# Patient Record
Sex: Male | Born: 1950
Health system: Southern US, Community
[De-identification: ages and names within clinical notes are randomized; demographics above are authoritative.]

## PROBLEM LIST (undated history)

## (undated) DIAGNOSIS — Z9289 Personal history of other medical treatment: Secondary | ICD-10-CM

## (undated) DIAGNOSIS — R531 Weakness: Secondary | ICD-10-CM

## (undated) DIAGNOSIS — N400 Enlarged prostate without lower urinary tract symptoms: Secondary | ICD-10-CM

## (undated) DIAGNOSIS — R351 Nocturia: Secondary | ICD-10-CM

## (undated) DIAGNOSIS — F32A Depression, unspecified: Secondary | ICD-10-CM

## (undated) DIAGNOSIS — N200 Calculus of kidney: Secondary | ICD-10-CM

## (undated) DIAGNOSIS — A159 Respiratory tuberculosis unspecified: Secondary | ICD-10-CM

## (undated) DIAGNOSIS — F329 Major depressive disorder, single episode, unspecified: Secondary | ICD-10-CM

## (undated) DIAGNOSIS — E785 Hyperlipidemia, unspecified: Secondary | ICD-10-CM

## (undated) DIAGNOSIS — H409 Unspecified glaucoma: Secondary | ICD-10-CM

## (undated) DIAGNOSIS — J449 Chronic obstructive pulmonary disease, unspecified: Secondary | ICD-10-CM

## (undated) DIAGNOSIS — I10 Essential (primary) hypertension: Secondary | ICD-10-CM

## (undated) DIAGNOSIS — M549 Dorsalgia, unspecified: Secondary | ICD-10-CM

## (undated) HISTORY — PX: OTHER SURGICAL HISTORY: SHX169

## (undated) HISTORY — PX: COLONOSCOPY: SHX174

## (undated) HISTORY — PX: RETINAL DETACHMENT SURGERY: SHX105

---

## 2014-10-07 DIAGNOSIS — H4011X2 Primary open-angle glaucoma, moderate stage: Secondary | ICD-10-CM | POA: Diagnosis not present

## 2014-10-25 DIAGNOSIS — E782 Mixed hyperlipidemia: Secondary | ICD-10-CM | POA: Diagnosis not present

## 2014-10-25 DIAGNOSIS — Z125 Encounter for screening for malignant neoplasm of prostate: Secondary | ICD-10-CM | POA: Diagnosis not present

## 2014-10-25 DIAGNOSIS — F329 Major depressive disorder, single episode, unspecified: Secondary | ICD-10-CM | POA: Diagnosis not present

## 2014-10-25 DIAGNOSIS — Z1211 Encounter for screening for malignant neoplasm of colon: Secondary | ICD-10-CM | POA: Diagnosis not present

## 2014-10-25 DIAGNOSIS — I10 Essential (primary) hypertension: Secondary | ICD-10-CM | POA: Diagnosis not present

## 2014-10-25 DIAGNOSIS — Z23 Encounter for immunization: Secondary | ICD-10-CM | POA: Diagnosis not present

## 2014-11-09 DIAGNOSIS — Z1211 Encounter for screening for malignant neoplasm of colon: Secondary | ICD-10-CM | POA: Diagnosis not present

## 2015-01-03 DIAGNOSIS — H4011X2 Primary open-angle glaucoma, moderate stage: Secondary | ICD-10-CM | POA: Diagnosis not present

## 2015-02-24 DIAGNOSIS — L570 Actinic keratosis: Secondary | ICD-10-CM | POA: Diagnosis not present

## 2015-04-27 DIAGNOSIS — F339 Major depressive disorder, recurrent, unspecified: Secondary | ICD-10-CM | POA: Diagnosis not present

## 2015-04-27 DIAGNOSIS — Z6832 Body mass index (BMI) 32.0-32.9, adult: Secondary | ICD-10-CM | POA: Diagnosis not present

## 2015-04-27 DIAGNOSIS — E6609 Other obesity due to excess calories: Secondary | ICD-10-CM | POA: Diagnosis not present

## 2015-04-27 DIAGNOSIS — I10 Essential (primary) hypertension: Secondary | ICD-10-CM | POA: Diagnosis not present

## 2015-04-27 DIAGNOSIS — E782 Mixed hyperlipidemia: Secondary | ICD-10-CM | POA: Diagnosis not present

## 2015-05-10 DIAGNOSIS — M545 Low back pain: Secondary | ICD-10-CM | POA: Diagnosis not present

## 2015-05-23 DIAGNOSIS — M545 Low back pain: Secondary | ICD-10-CM | POA: Diagnosis not present

## 2015-06-02 DIAGNOSIS — M545 Low back pain: Secondary | ICD-10-CM | POA: Diagnosis not present

## 2015-06-03 DIAGNOSIS — M5416 Radiculopathy, lumbar region: Secondary | ICD-10-CM | POA: Diagnosis not present

## 2015-06-06 ENCOUNTER — Other Ambulatory Visit: Payer: Self-pay | Admitting: Orthopedic Surgery

## 2015-06-15 ENCOUNTER — Encounter (HOSPITAL_COMMUNITY): Payer: Self-pay

## 2015-06-15 ENCOUNTER — Ambulatory Visit (HOSPITAL_COMMUNITY)
Admission: RE | Admit: 2015-06-15 | Discharge: 2015-06-15 | Disposition: A | Discharge status: Home / Self Care | Payer: Medicare Other | Source: Ambulatory Visit | Attending: Orthopedic Surgery | Admitting: Orthopedic Surgery

## 2015-06-15 ENCOUNTER — Encounter (HOSPITAL_COMMUNITY)
Admission: RE | Admit: 2015-06-15 | Discharge: 2015-06-15 | Disposition: A | Discharge status: Home / Self Care | Payer: Medicare Other | Source: Ambulatory Visit | Attending: Orthopedic Surgery | Admitting: Orthopedic Surgery

## 2015-06-15 DIAGNOSIS — M5126 Other intervertebral disc displacement, lumbar region: Secondary | ICD-10-CM | POA: Diagnosis present

## 2015-06-15 DIAGNOSIS — G8929 Other chronic pain: Secondary | ICD-10-CM | POA: Diagnosis not present

## 2015-06-15 DIAGNOSIS — I10 Essential (primary) hypertension: Secondary | ICD-10-CM | POA: Diagnosis not present

## 2015-06-15 DIAGNOSIS — J449 Chronic obstructive pulmonary disease, unspecified: Secondary | ICD-10-CM | POA: Diagnosis not present

## 2015-06-15 DIAGNOSIS — M5116 Intervertebral disc disorders with radiculopathy, lumbar region: Secondary | ICD-10-CM | POA: Diagnosis not present

## 2015-06-15 DIAGNOSIS — Z87891 Personal history of nicotine dependence: Secondary | ICD-10-CM | POA: Diagnosis not present

## 2015-06-15 DIAGNOSIS — F329 Major depressive disorder, single episode, unspecified: Secondary | ICD-10-CM | POA: Diagnosis not present

## 2015-06-15 DIAGNOSIS — E785 Hyperlipidemia, unspecified: Secondary | ICD-10-CM | POA: Diagnosis not present

## 2015-06-15 DIAGNOSIS — Z01818 Encounter for other preprocedural examination: Secondary | ICD-10-CM

## 2015-06-15 DIAGNOSIS — J984 Other disorders of lung: Secondary | ICD-10-CM | POA: Diagnosis not present

## 2015-06-15 DIAGNOSIS — Z6835 Body mass index (BMI) 35.0-35.9, adult: Secondary | ICD-10-CM | POA: Diagnosis not present

## 2015-06-15 HISTORY — DX: Essential (primary) hypertension: I10

## 2015-06-15 HISTORY — DX: Unspecified glaucoma: H40.9

## 2015-06-15 HISTORY — DX: Weakness: R53.1

## 2015-06-15 HISTORY — DX: Nocturia: R35.1

## 2015-06-15 HISTORY — DX: Depression, unspecified: F32.A

## 2015-06-15 HISTORY — DX: Chronic obstructive pulmonary disease, unspecified: J44.9

## 2015-06-15 HISTORY — DX: Respiratory tuberculosis unspecified: A15.9

## 2015-06-15 HISTORY — DX: Hyperlipidemia, unspecified: E78.5

## 2015-06-15 HISTORY — DX: Personal history of other medical treatment: Z92.89

## 2015-06-15 HISTORY — DX: Benign prostatic hyperplasia without lower urinary tract symptoms: N40.0

## 2015-06-15 HISTORY — DX: Major depressive disorder, single episode, unspecified: F32.9

## 2015-06-15 HISTORY — DX: Dorsalgia, unspecified: M54.9

## 2015-06-15 LAB — CBC WITH DIFFERENTIAL/PLATELET
BASOS ABS: 0.1 10*3/uL (ref 0.0–0.1)
BASOS PCT: 1 % (ref 0–1)
Eosinophils Absolute: 0.1 10*3/uL (ref 0.0–0.7)
Eosinophils Relative: 2 % (ref 0–5)
HEMATOCRIT: 42.6 % (ref 39.0–52.0)
HEMOGLOBIN: 14.7 g/dL (ref 13.0–17.0)
Lymphocytes Relative: 33 % (ref 12–46)
Lymphs Abs: 2.2 10*3/uL (ref 0.7–4.0)
MCH: 32 pg (ref 26.0–34.0)
MCHC: 34.5 g/dL (ref 30.0–36.0)
MCV: 92.8 fL (ref 78.0–100.0)
Monocytes Absolute: 0.5 10*3/uL (ref 0.1–1.0)
Monocytes Relative: 7 % (ref 3–12)
Neutro Abs: 3.7 10*3/uL (ref 1.7–7.7)
Neutrophils Relative %: 57 % (ref 43–77)
PLATELETS: 254 10*3/uL (ref 150–400)
RBC: 4.59 MIL/uL (ref 4.22–5.81)
RDW: 13.5 % (ref 11.5–15.5)
WBC: 6.6 10*3/uL (ref 4.0–10.5)

## 2015-06-15 LAB — SURGICAL PCR SCREEN
MRSA, PCR: NEGATIVE
Staphylococcus aureus: NEGATIVE

## 2015-06-15 LAB — APTT: APTT: 29 s (ref 24–37)

## 2015-06-15 LAB — COMPREHENSIVE METABOLIC PANEL
ALT: 24 U/L (ref 17–63)
AST: 21 U/L (ref 15–41)
Albumin: 4 g/dL (ref 3.5–5.0)
Alkaline Phosphatase: 57 U/L (ref 38–126)
Anion gap: 8 (ref 5–15)
BUN: 14 mg/dL (ref 6–20)
CHLORIDE: 106 mmol/L (ref 101–111)
CO2: 25 mmol/L (ref 22–32)
Calcium: 9.4 mg/dL (ref 8.9–10.3)
Creatinine, Ser: 1.12 mg/dL (ref 0.61–1.24)
Glucose, Bld: 102 mg/dL — ABNORMAL HIGH (ref 65–99)
POTASSIUM: 3.6 mmol/L (ref 3.5–5.1)
SODIUM: 139 mmol/L (ref 135–145)
Total Bilirubin: 0.4 mg/dL (ref 0.3–1.2)
Total Protein: 6.1 g/dL — ABNORMAL LOW (ref 6.5–8.1)

## 2015-06-15 LAB — URINALYSIS, ROUTINE W REFLEX MICROSCOPIC
Bilirubin Urine: NEGATIVE
Glucose, UA: NEGATIVE mg/dL
Hgb urine dipstick: NEGATIVE
Ketones, ur: NEGATIVE mg/dL
LEUKOCYTES UA: NEGATIVE
Nitrite: NEGATIVE
PROTEIN: NEGATIVE mg/dL
Specific Gravity, Urine: 1.01 (ref 1.005–1.030)
Urobilinogen, UA: 0.2 mg/dL (ref 0.0–1.0)
pH: 6 (ref 5.0–8.0)

## 2015-06-15 LAB — PROTIME-INR
INR: 1.04 (ref 0.00–1.49)
PROTHROMBIN TIME: 13.8 s (ref 11.6–15.2)

## 2015-06-15 MED ORDER — POVIDONE-IODINE 7.5 % EX SOLN: Freq: Once | CUTANEOUS | Status: DC

## 2015-06-15 NOTE — Progress Notes (Signed)
Pt doesn't have a cardiologist  Dr.Dean Alroy Dust is Medical Md  Denies ever having an echo/stress test/heart cath  Denies EKG or CXR in past

## 2015-06-15 NOTE — Progress Notes (Signed)
   06/15/15 1401  OBSTRUCTIVE SLEEP APNEA  Have you ever been diagnosed with sleep apnea through a sleep study? No  Do you snore loudly (loud enough to be heard through closed doors)?  1  Do you often feel tired, fatigued, or sleepy during the daytime? 0  Has anyone observed you stop breathing during your sleep? 1  Do you have, or are you being treated for high blood pressure? 1  BMI more than 35 kg/m2? 0  Age over 66 years old? 1  Neck circumference greater than 40 cm/16 inches? 1 (17.5)  Gender: 1

## 2015-06-15 NOTE — Pre-Procedure Instructions (Signed)
Steven Ellison  06/15/2015      [id number] [place], [place] [address] [place] [address] Phone: [phone] Fax: [phone]    Your procedure is scheduled on Wed, June 22 @ 12:55 PM  Report to [facility] Admitting at 10:00 AM  Call this number if you have problems the morning of surgery:  [phone]   Remember:  Do not eat food or drink liquids after midnight.  Take these medicines the morning of surgery with A SIP OF WATER:Eye Drop,Pain Pill(if needed),and Paxil(Paroxetine)              Stop taking your Fish Oil and Saw Palmetto. No Goody's,BC's,Aleve,Aspirin,Ibuprofen,or any Herbal Medications.    Do not wear jewelry.  Do not wear lotions, powders, or colognes.  You may wear deodorant.  Men may shave face and neck.  Do not bring valuables to the hospital.  [facility] is not responsible for any belongings or valuables.  Contacts, dentures or bridgework may not be worn into surgery.  Leave your suitcase in the car.  After surgery it may be brought to your room.  For patients admitted to the hospital, discharge time will be determined by your treatment team.  Patients discharged the day of surgery will not be allowed to drive home.    Special instructions:  Register - Preparing for Surgery  Before surgery, you can play an important role.  Because skin is not sterile, your skin needs to be as free of germs as possible.  You can reduce the number of germs on you skin by washing with CHG (chlorahexidine gluconate) soap before surgery.  CHG is an antiseptic cleaner which kills germs and bonds with the skin to continue killing germs even after washing.  Please DO NOT use if you have an allergy to CHG or antibacterial soaps.  If your skin becomes reddened/irritated stop using the CHG and inform your nurse when you arrive at Short Stay.  Do not shave (including legs and underarms) for at least 48 hours prior to the  first CHG shower.  You may shave your face.  Please follow these instructions carefully:   1.  Shower with CHG Soap the night before surgery and the                                morning of Surgery.  2.  If you choose to wash your hair, wash your hair first as usual with your       normal shampoo.  3.  After you shampoo, rinse your hair and body thoroughly to remove the                      Shampoo.  4.  Use CHG as you would any other liquid soap.  You can apply chg directly       to the skin and wash gently with scrungie or a clean washcloth.  5.  Apply the CHG Soap to your body ONLY FROM THE NECK DOWN.        Do not use on open wounds or open sores.  Avoid contact with your eyes,       ears, mouth and genitals (private parts).  Wash genitals (private parts)       with your normal soap.  6.  Wash thoroughly, paying special attention to the area where  your surgery        will be performed.  7.  Thoroughly rinse your body with warm water from the neck down.  8.  DO NOT shower/wash with your normal soap after using and rinsing off       the CHG Soap.  9.  Pat yourself dry with a clean towel.            10.  Wear clean pajamas.            11.  Place clean sheets on your bed the night of your first shower and do not        sleep with pets.  Day of Surgery  Do not apply any lotions/deoderants the morning of surgery.  Please wear clean clothes to the hospital/surgery center.    Please read over the following fact sheets that you were given. Pain Booklet, Coughing and Deep Breathing, MRSA Information and Surgical Site Infection Prevention

## 2015-06-21 MED ORDER — CEFAZOLIN SODIUM-DEXTROSE 2-3 GM-% IV SOLR
2.0000 g | INTRAVENOUS | Status: AC
  Administered 2015-06-22: 2 g via INTRAVENOUS
  Filled 2015-06-21: qty 50

## 2015-06-22 ENCOUNTER — Ambulatory Visit (HOSPITAL_COMMUNITY): Payer: Medicare Other

## 2015-06-22 ENCOUNTER — Encounter (HOSPITAL_COMMUNITY)
Admission: RE | Disposition: A | Discharge status: Home / Self Care | Payer: Medicare Other | Source: Ambulatory Visit | Attending: Orthopedic Surgery

## 2015-06-22 ENCOUNTER — Encounter (HOSPITAL_COMMUNITY): Payer: Self-pay | Admitting: *Deleted

## 2015-06-22 ENCOUNTER — Ambulatory Visit (HOSPITAL_COMMUNITY): Payer: Medicare Other | Admitting: Anesthesiology

## 2015-06-22 ENCOUNTER — Ambulatory Visit (HOSPITAL_COMMUNITY)
Admission: RE | Admit: 2015-06-22 | Discharge: 2015-06-22 | Disposition: A | Discharge status: Home / Self Care | Payer: Medicare Other | Source: Ambulatory Visit | Attending: Orthopedic Surgery | Admitting: Orthopedic Surgery

## 2015-06-22 DIAGNOSIS — J449 Chronic obstructive pulmonary disease, unspecified: Secondary | ICD-10-CM | POA: Diagnosis not present

## 2015-06-22 DIAGNOSIS — M5116 Intervertebral disc disorders with radiculopathy, lumbar region: Secondary | ICD-10-CM | POA: Insufficient documentation

## 2015-06-22 DIAGNOSIS — M5416 Radiculopathy, lumbar region: Secondary | ICD-10-CM | POA: Diagnosis not present

## 2015-06-22 DIAGNOSIS — F329 Major depressive disorder, single episode, unspecified: Secondary | ICD-10-CM | POA: Insufficient documentation

## 2015-06-22 DIAGNOSIS — Z6835 Body mass index (BMI) 35.0-35.9, adult: Secondary | ICD-10-CM | POA: Insufficient documentation

## 2015-06-22 DIAGNOSIS — M5417 Radiculopathy, lumbosacral region: Secondary | ICD-10-CM | POA: Diagnosis not present

## 2015-06-22 DIAGNOSIS — Z87891 Personal history of nicotine dependence: Secondary | ICD-10-CM | POA: Diagnosis not present

## 2015-06-22 DIAGNOSIS — M5126 Other intervertebral disc displacement, lumbar region: Secondary | ICD-10-CM | POA: Diagnosis not present

## 2015-06-22 DIAGNOSIS — I10 Essential (primary) hypertension: Secondary | ICD-10-CM | POA: Insufficient documentation

## 2015-06-22 DIAGNOSIS — M5410 Radiculopathy, site unspecified: Secondary | ICD-10-CM

## 2015-06-22 DIAGNOSIS — G8929 Other chronic pain: Secondary | ICD-10-CM | POA: Insufficient documentation

## 2015-06-22 DIAGNOSIS — M549 Dorsalgia, unspecified: Secondary | ICD-10-CM | POA: Diagnosis not present

## 2015-06-22 DIAGNOSIS — E785 Hyperlipidemia, unspecified: Secondary | ICD-10-CM | POA: Insufficient documentation

## 2015-06-22 HISTORY — PX: LUMBAR LAMINECTOMY/DECOMPRESSION MICRODISCECTOMY: SHX5026

## 2015-06-22 SURGERY — LUMBAR LAMINECTOMY/DECOMPRESSION MICRODISCECTOMY
Anesthesia: General | Site: Back

## 2015-06-22 MED ORDER — BUPIVACAINE-EPINEPHRINE 0.25% -1:200000 IJ SOLN
INTRAMUSCULAR | Status: DC | PRN
  Administered 2015-06-22: 13 mL

## 2015-06-22 MED ORDER — THROMBIN 20000 UNITS EX KIT
PACK | CUTANEOUS | Status: AC
  Filled 2015-06-22: qty 1

## 2015-06-22 MED ORDER — PROPOFOL 10 MG/ML IV BOLUS
INTRAVENOUS | Status: AC
  Filled 2015-06-22: qty 20

## 2015-06-22 MED ORDER — ONDANSETRON HCL 4 MG/2ML IJ SOLN
INTRAMUSCULAR | Status: AC
  Filled 2015-06-22: qty 2

## 2015-06-22 MED ORDER — MIDAZOLAM HCL 5 MG/5ML IJ SOLN
INTRAMUSCULAR | Status: DC | PRN
  Administered 2015-06-22: 2 mg via INTRAVENOUS

## 2015-06-22 MED ORDER — FENTANYL CITRATE (PF) 100 MCG/2ML IJ SOLN
INTRAMUSCULAR | Status: DC | PRN
  Administered 2015-06-22: 25 ug via INTRAVENOUS
  Administered 2015-06-22 (×2): 50 ug via INTRAVENOUS
  Administered 2015-06-22: 25 ug via INTRAVENOUS
  Administered 2015-06-22: 50 ug via INTRAVENOUS
  Administered 2015-06-22: 200 ug via INTRAVENOUS
  Administered 2015-06-22: 50 ug via INTRAVENOUS

## 2015-06-22 MED ORDER — FENTANYL CITRATE (PF) 250 MCG/5ML IJ SOLN
INTRAMUSCULAR | Status: AC
  Filled 2015-06-22: qty 5

## 2015-06-22 MED ORDER — FENTANYL CITRATE (PF) 250 MCG/5ML IJ SOLN
INTRAMUSCULAR | Status: AC
Start: 2015-06-22 — End: 2015-06-22
  Filled 2015-06-22: qty 5

## 2015-06-22 MED ORDER — OXYCODONE-ACETAMINOPHEN 5-325 MG PO TABS
1.0000 | ORAL_TABLET | Freq: Once | ORAL | Status: AC
  Administered 2015-06-22: 1 via ORAL

## 2015-06-22 MED ORDER — HYDROMORPHONE HCL 1 MG/ML IJ SOLN
0.2500 mg | INTRAMUSCULAR | Status: DC | PRN
  Administered 2015-06-22: 0.5 mg via INTRAVENOUS

## 2015-06-22 MED ORDER — ROCURONIUM BROMIDE 50 MG/5ML IV SOLN
INTRAVENOUS | Status: AC
  Filled 2015-06-22: qty 1

## 2015-06-22 MED ORDER — PROPOFOL 10 MG/ML IV BOLUS
INTRAVENOUS | Status: DC | PRN
  Administered 2015-06-22: 150 mg via INTRAVENOUS

## 2015-06-22 MED ORDER — LIDOCAINE HCL (CARDIAC) 20 MG/ML IV SOLN
INTRAVENOUS | Status: AC
  Filled 2015-06-22: qty 5

## 2015-06-22 MED ORDER — DEXAMETHASONE SODIUM PHOSPHATE 4 MG/ML IJ SOLN
INTRAMUSCULAR | Status: DC | PRN
  Administered 2015-06-22: 8 mg via INTRAVENOUS

## 2015-06-22 MED ORDER — MIDAZOLAM HCL 2 MG/2ML IJ SOLN
INTRAMUSCULAR | Status: AC
  Filled 2015-06-22: qty 2

## 2015-06-22 MED ORDER — ONDANSETRON HCL 4 MG/2ML IJ SOLN
INTRAMUSCULAR | Status: DC | PRN
  Administered 2015-06-22: 4 mg via INTRAVENOUS

## 2015-06-22 MED ORDER — BUPIVACAINE-EPINEPHRINE (PF) 0.25% -1:200000 IJ SOLN
INTRAMUSCULAR | Status: AC
  Filled 2015-06-22: qty 30

## 2015-06-22 MED ORDER — METHYLPREDNISOLONE ACETATE 40 MG/ML IJ SUSP
INTRAMUSCULAR | Status: AC
  Filled 2015-06-22: qty 1

## 2015-06-22 MED ORDER — OXYCODONE-ACETAMINOPHEN 5-325 MG PO TABS
ORAL_TABLET | ORAL | Status: AC
  Filled 2015-06-22: qty 1

## 2015-06-22 MED ORDER — METHYLENE BLUE 1 % INJ SOLN
INTRAMUSCULAR | Status: AC
  Filled 2015-06-22: qty 10

## 2015-06-22 MED ORDER — SUCCINYLCHOLINE CHLORIDE 20 MG/ML IJ SOLN
INTRAMUSCULAR | Status: DC | PRN
  Administered 2015-06-22: 100 mg via INTRAVENOUS

## 2015-06-22 MED ORDER — METHYLPREDNISOLONE ACETATE 40 MG/ML IJ SUSP
INTRAMUSCULAR | Status: DC | PRN
  Administered 2015-06-22: 40 mg

## 2015-06-22 MED ORDER — MIDAZOLAM HCL 2 MG/2ML IJ SOLN: 0.5000 mg | Freq: Once | INTRAMUSCULAR | Status: DC | PRN

## 2015-06-22 MED ORDER — LACTATED RINGERS IV SOLN
INTRAVENOUS | Status: DC
  Administered 2015-06-22 (×3): via INTRAVENOUS

## 2015-06-22 MED ORDER — MEPERIDINE HCL 25 MG/ML IJ SOLN: 6.2500 mg | INTRAMUSCULAR | Status: DC | PRN

## 2015-06-22 MED ORDER — PROPOFOL INFUSION 10 MG/ML OPTIME
INTRAVENOUS | Status: DC | PRN
  Administered 2015-06-22: 30 ug/kg/min via INTRAVENOUS

## 2015-06-22 MED ORDER — THROMBIN 20000 UNITS EX SOLR
CUTANEOUS | Status: DC | PRN
  Administered 2015-06-22: 20000 mL via TOPICAL

## 2015-06-22 MED ORDER — HYDROMORPHONE HCL 1 MG/ML IJ SOLN
INTRAMUSCULAR | Status: AC
  Filled 2015-06-22: qty 1

## 2015-06-22 MED ORDER — PROMETHAZINE HCL 25 MG/ML IJ SOLN: 6.2500 mg | INTRAMUSCULAR | Status: DC | PRN

## 2015-06-22 MED ORDER — LIDOCAINE HCL (CARDIAC) 20 MG/ML IV SOLN
INTRAVENOUS | Status: DC | PRN
  Administered 2015-06-22: 25 mg via INTRAVENOUS

## 2015-06-22 MED ORDER — DEXAMETHASONE SODIUM PHOSPHATE 4 MG/ML IJ SOLN
INTRAMUSCULAR | Status: AC
  Filled 2015-06-22: qty 2

## 2015-06-22 MED ORDER — 0.9 % SODIUM CHLORIDE (POUR BTL) OPTIME
TOPICAL | Status: DC | PRN
Start: 2015-06-22 — End: 2015-06-22
  Administered 2015-06-22: 1000 mL

## 2015-06-22 SURGICAL SUPPLY — 72 items
BENZOIN TINCTURE PRP APPL 2/3 (GAUZE/BANDAGES/DRESSINGS) ×2 IMPLANT
BUR ROUND PRECISION 4.0 (BURR) IMPLANT
CANISTER SUCTION 2500CC (MISCELLANEOUS) ×2 IMPLANT
CARTRIDGE OIL MAESTRO DRILL (MISCELLANEOUS) IMPLANT
CLSR STERI-STRIP ANTIMIC 1/2X4 (GAUZE/BANDAGES/DRESSINGS) ×2 IMPLANT
CORDS BIPOLAR (ELECTRODE) ×2 IMPLANT
COVER SURGICAL LIGHT HANDLE (MISCELLANEOUS) ×2 IMPLANT
DIFFUSER DRILL AIR PNEUMATIC (MISCELLANEOUS) IMPLANT
DRAIN CHANNEL 15F RND FF W/TCR (WOUND CARE) IMPLANT
DRAPE POUCH INSTRU U-SHP 10X18 (DRAPES) ×4 IMPLANT
DRAPE SURG 17X23 STRL (DRAPES) ×8 IMPLANT
DURAPREP 26ML APPLICATOR (WOUND CARE) ×2 IMPLANT
ELECT BLADE 4.0 EZ CLEAN MEGAD (MISCELLANEOUS)
ELECT CAUTERY BLADE 6.4 (BLADE) ×2 IMPLANT
ELECT REM PT RETURN 9FT ADLT (ELECTROSURGICAL) ×2
ELECTRODE BLDE 4.0 EZ CLN MEGD (MISCELLANEOUS) IMPLANT
ELECTRODE REM PT RTRN 9FT ADLT (ELECTROSURGICAL) ×1 IMPLANT
EVACUATOR SILICONE 100CC (DRAIN) IMPLANT
FILTER STRAW FLUID ASPIR (MISCELLANEOUS) ×2 IMPLANT
GAUZE SPONGE 4X4 12PLY STRL (GAUZE/BANDAGES/DRESSINGS) ×2 IMPLANT
GAUZE SPONGE 4X4 16PLY XRAY LF (GAUZE/BANDAGES/DRESSINGS) ×4 IMPLANT
GLOVE BIO SURGEON STRL SZ7 (GLOVE) ×4 IMPLANT
GLOVE BIO SURGEON STRL SZ8 (GLOVE) ×2 IMPLANT
GLOVE BIOGEL PI IND STRL 7.0 (GLOVE) ×1 IMPLANT
GLOVE BIOGEL PI IND STRL 8 (GLOVE) ×1 IMPLANT
GLOVE BIOGEL PI INDICATOR 7.0 (GLOVE) ×1
GLOVE BIOGEL PI INDICATOR 8 (GLOVE) ×1
GOWN STRL REUS W/ TWL LRG LVL3 (GOWN DISPOSABLE) ×1 IMPLANT
GOWN STRL REUS W/ TWL XL LVL3 (GOWN DISPOSABLE) ×2 IMPLANT
GOWN STRL REUS W/TWL LRG LVL3 (GOWN DISPOSABLE) ×1
GOWN STRL REUS W/TWL XL LVL3 (GOWN DISPOSABLE) ×2
IV CATH 14GX2 1/4 (CATHETERS) ×2 IMPLANT
KIT BASIN OR (CUSTOM PROCEDURE TRAY) ×2 IMPLANT
KIT POSITION SURG JACKSON T1 (MISCELLANEOUS) ×2 IMPLANT
KIT ROOM TURNOVER OR (KITS) ×2 IMPLANT
MATRIX HEMOSTAT SURGIFLO (HEMOSTASIS) ×2 IMPLANT
NEEDLE 18GX1X1/2 (RX/OR ONLY) (NEEDLE) ×2 IMPLANT
NEEDLE 22X1 1/2 (OR ONLY) (NEEDLE) ×2 IMPLANT
NEEDLE HYPO 25GX1X1/2 BEV (NEEDLE) ×2 IMPLANT
NEEDLE SPNL 18GX3.5 QUINCKE PK (NEEDLE) ×4 IMPLANT
NEURO MONITORING STIM (LABOR (TRAVEL & OVERTIME)) ×2 IMPLANT
NS IRRIG 1000ML POUR BTL (IV SOLUTION) ×2 IMPLANT
OIL CARTRIDGE MAESTRO DRILL (MISCELLANEOUS)
PACK LAMINECTOMY ORTHO (CUSTOM PROCEDURE TRAY) ×2 IMPLANT
PACK UNIVERSAL I (CUSTOM PROCEDURE TRAY) ×2 IMPLANT
PAD ARMBOARD 7.5X6 YLW CONV (MISCELLANEOUS) ×4 IMPLANT
PATTIES SURGICAL .5 X.5 (GAUZE/BANDAGES/DRESSINGS) IMPLANT
PATTIES SURGICAL .5 X1 (DISPOSABLE) ×2 IMPLANT
SPONGE GAUZE 4X4 12PLY STER LF (GAUZE/BANDAGES/DRESSINGS) ×2 IMPLANT
SPONGE INTESTINAL PEANUT (DISPOSABLE) ×2 IMPLANT
SPONGE SURGIFOAM ABS GEL 100 (HEMOSTASIS) ×2 IMPLANT
SPONGE SURGIFOAM ABS GEL SZ50 (HEMOSTASIS) ×2 IMPLANT
STRIP CLOSURE SKIN 1/2X4 (GAUZE/BANDAGES/DRESSINGS) IMPLANT
SURGIFLO TRUKIT (HEMOSTASIS) IMPLANT
SUT MNCRL AB 4-0 PS2 18 (SUTURE) ×2 IMPLANT
SUT VIC AB 0 CT1 18XCR BRD 8 (SUTURE) IMPLANT
SUT VIC AB 0 CT1 27 (SUTURE)
SUT VIC AB 0 CT1 27XBRD ANBCTR (SUTURE) IMPLANT
SUT VIC AB 0 CT1 8-18 (SUTURE)
SUT VIC AB 1 CT1 18XCR BRD 8 (SUTURE) ×1 IMPLANT
SUT VIC AB 1 CT1 8-18 (SUTURE) ×1
SUT VIC AB 2-0 CT2 18 VCP726D (SUTURE) ×2 IMPLANT
SYR 20CC LL (SYRINGE) IMPLANT
SYR BULB IRRIGATION 50ML (SYRINGE) ×2 IMPLANT
SYR CONTROL 10ML LL (SYRINGE) ×4 IMPLANT
SYR TB 1ML 26GX3/8 SAFETY (SYRINGE) ×4 IMPLANT
SYR TB 1ML LUER SLIP (SYRINGE) ×4 IMPLANT
TAPE CLOTH SURG 4X10 WHT LF (GAUZE/BANDAGES/DRESSINGS) ×2 IMPLANT
TOWEL OR 17X24 6PK STRL BLUE (TOWEL DISPOSABLE) ×2 IMPLANT
TOWEL OR 17X26 10 PK STRL BLUE (TOWEL DISPOSABLE) ×2 IMPLANT
WATER STERILE IRR 1000ML POUR (IV SOLUTION) ×2 IMPLANT
YANKAUER SUCT BULB TIP NO VENT (SUCTIONS) ×2 IMPLANT

## 2015-06-22 NOTE — Anesthesia Preprocedure Evaluation (Addendum)
Anesthesia Evaluation  Patient identified by MRN, date of birth, ID band Patient awake    Reviewed: Allergy & Precautions, NPO status , Patient's Chart, lab work & pertinent test results  History of Anesthesia Complications Negative for: history of anesthetic complications  Airway Mallampati: II  TM Distance: >3 FB Neck ROM: Full    Dental  (+) Dental Advisory Given   Pulmonary COPDformer smoker,  breath sounds clear to auscultation        Cardiovascular hypertension, Pt. on medications - anginaRhythm:Regular Rate:Normal     Neuro/Psych Depression Chronic back pain: narcotics    GI/Hepatic negative GI ROS, Neg liver ROS,   Endo/Other  Morbid obesity  Renal/GU negative Renal ROS     Musculoskeletal   Abdominal (+) + obese,   Peds  Hematology negative hematology ROS (+)   Anesthesia Other Findings   Reproductive/Obstetrics                         Anesthesia Physical Anesthesia Plan  ASA: III  Anesthesia Plan: General   Post-op Pain Management:    Induction: Intravenous  Airway Management Planned: Oral ETT  Additional Equipment:   Intra-op Plan:   Post-operative Plan: Extubation in OR  Informed Consent: I have reviewed the patients History and Physical, chart, labs and discussed the procedure including the risks, benefits and alternatives for the proposed anesthesia with the patient or authorized representative who has indicated his/her understanding and acceptance.   Dental advisory given  Plan Discussed with: CRNA and Surgeon  Anesthesia Plan Comments: (Plan routine monitors, GETA)        Anesthesia Quick Evaluation

## 2015-06-22 NOTE — Anesthesia Postprocedure Evaluation (Signed)
  Anesthesia Post-op Note  Patient: Steven Ellison  Procedure(s) Performed: Procedure(s) with comments: LUMBAR LAMINECTOMY/DECOMPRESSION MICRODISCECTOMY (N/A) - Lumbar 3-4 decompression  Patient Location: PACU  Anesthesia Type:General  Level of Consciousness: awake, alert  and oriented  Airway and Oxygen Therapy: Patient Spontanous Breathing  Post-op Pain: mild  Post-op Assessment: Post-op Vital signs reviewed              Post-op Vital Signs: Reviewed  Last Vitals:  Filed Vitals:   06/22/15 1955  BP: 125/75  Pulse: 91  Temp:   Resp: 17    Complications: No apparent anesthesia complications

## 2015-06-22 NOTE — Transfer of Care (Signed)
Immediate Anesthesia Transfer of Care Note  Patient: Steven Ellison  Procedure(s) Performed: Procedure(s) with comments: LUMBAR LAMINECTOMY/DECOMPRESSION MICRODISCECTOMY (N/A) - Lumbar 3-4 decompression  Patient Location: PACU  Anesthesia Type:General  Level of Consciousness: awake, alert , oriented and patient cooperative  Airway & Oxygen Therapy: Patient Spontanous Breathing and Patient connected to nasal cannula oxygen  Post-op Assessment: Report given to RN and Post -op Vital signs reviewed and stable  Post vital signs: Reviewed and stable  Last Vitals:  Filed Vitals:   06/22/15 1825  BP:   Pulse: 81  Temp:   Resp: 12    Complications: No apparent anesthesia complications

## 2015-06-22 NOTE — H&P (Signed)
PREOPERATIVE H&P  Chief Complaint: L anterior thigh pain  HPI: Steven Ellison is a 64 y.o. male who presents with ongoing pain in the anterior thigh on the left x 4 months  MRI reveals an extraforaminal HNP on the left at L3/4  Patient has failed multiple forms of conservative care and continues to have pain (see office notes for additional details regarding the patient's full course of treatment)  Past Medical History  Diagnosis Date  . Hypertension     takes Lisinopril daily  . Depression     takes Paxil daily  . Hyperlipidemia     takes Fenofibrate daily  . Glaucoma     uses eye drops daily  . Tuberculosis     as a child  . COPD (chronic obstructive pulmonary disease)     "mild per pt"  . Weakness     numbness and tingling in left leg  . Back pain     spurs  . Nocturia     takes [employer] daily  . Enlarged prostate   . History of blood transfusion     as a child   Past Surgical History  Procedure Laterality Date  . Retinal detachment surgery Left   . Cataract and glaucoma  Bilateral   . Colonoscopy     History   Social History  . Marital Status: Married    Spouse Name: N/A  . Number of Children: N/A  . Years of Education: N/A   Social History Main Topics  . Smoking status: Former Research scientist (life sciences)  . Smokeless tobacco: Not on file     Comment: quit smoking in 2005  . Alcohol Use: Yes     Comment: wine rarely  . Drug Use: No  . Sexual Activity: Not Currently   Other Topics Concern  . Not on file   Social History Narrative  . No narrative on file   No family history on file. Not on File Prior to Admission medications   Medication Sig Start Date End Date Taking? Authorizing Provider  cyclobenzaprine (FLEXERIL) 5 MG tablet Take 5 mg by mouth 2 (two) times daily. 05/23/15  Yes Historical Provider, MD  dorzolamide-timolol (COSOPT) 22.3-6.8 MG/ML ophthalmic solution Place 1 drop into the right eye daily. 05/06/15  Yes Historical Provider, MD  fenofibrate  160 MG tablet Take 160 mg by mouth daily. 03/13/15  Yes Historical Provider, MD  HYDROcodone-acetaminophen (NORCO/VICODIN) 5-325 MG per tablet Take 1 tablet by mouth every 4 (four) hours as needed. 06/04/15  Yes Historical Provider, MD  lisinopril (PRINIVIL,ZESTRIL) 20 MG tablet Take 20 mg by mouth daily. 03/13/15  Yes Historical Provider, MD  PARoxetine (PAXIL-CR) 25 MG 24 hr tablet Take 25 mg by mouth every morning. 04/07/15  Yes Historical Provider, MD  TRAVATAN Z 0.004 % SOLN ophthalmic solution Place 1 drop into the right eye every evening. 05/06/15  Yes Historical Provider, MD  acetaminophen (TYLENOL) 500 MG tablet Take 500 mg by mouth 3 (three) times daily.    Historical Provider, MD  Omega-3 Fatty Acids (FISH OIL) 1200 MG CAPS Take 2 capsules by mouth daily.    Historical Provider, MD  Saw Palmetto, Serenoa repens, (SAW PALMETTO PO) Take 1 tablet by mouth daily.    Historical Provider, MD     All other systems have been reviewed and were otherwise negative with the exception of those mentioned in the HPI and as above.  Physical Exam: There were no vitals filed for this visit.  General:  Alert, no acute distress Cardiovascular: No pedal edema Respiratory: No cyanosis, no use of accessory musculature Skin: No lesions in the area of chief complaint Neurologic: Sensation intact distally Psychiatric: Patient is competent for consent with normal mood and affect Lymphatic: No axillary or cervical lymphadenopathy   Assessment/Plan: Radiculopathy Plan for Procedure(s): LUMBAR LAMINECTOMY/DECOMPRESSION MICRODISCECTOMY   Sinclair Ship, MD 06/22/2015 8:00 AM

## 2015-06-23 ENCOUNTER — Encounter (HOSPITAL_COMMUNITY): Payer: Self-pay | Admitting: Orthopedic Surgery

## 2015-06-23 MED FILL — Thrombin For Soln Kit 20000 Unit: CUTANEOUS | Qty: 1 | Status: AC

## 2015-06-23 NOTE — Op Note (Signed)
[name], [name] NO.:  [id number]  MEDICAL RECORD NO.:  [id number]  LOCATION:  [facility]                         FACILITY:  [facility]  PHYSICIAN:  Phylliss Bob, MD      DATE OF BIRTH:  [birth year]  DATE OF PROCEDURE:  06/22/2015 DATE OF DISCHARGE:  06/22/2015                              OPERATIVE REPORT   PREOPERATIVE DIAGNOSES: 1. Left L3 radiculopathy. 2. Left-sided L3-L4 extraforaminal disk herniation.  POSTOPERATIVE DIAGNOSES: 1. Left L3 radiculopathy. 2. Left-sided L3-L4 extraforaminal disk herniation.  PROCEDURE:  Left-sided L3-L4 transpedicular decompression with removal of large extraforaminal disk herniation compressing the left L3 nerve.  SURGEON:  Phylliss Bob, MD.  ASSISTANTPricilla Holm, PA-C.  ANESTHESIA:  General endotracheal anesthesia.  COMPLICATIONS:  None.  DISPOSITION:  Stable.  ESTIMATED BLOOD LOSS:  Minimal.  INDICATIONS FOR SURGERY:  Briefly, [name] is a very pleasant 64 year old male, who did present to me with severe pain in the left leg.  An MRI did clearly reveal a large left L3-L4 disc herniation.  He did describe weakness and he did have ongoing and debilitating pain for 4 months.  We therefore did discuss proceeding with the procedure reflected above.  OPERATIVE DETAILS:  On June 22, 2015 patient was brought to surgery and general endotracheal anesthesia was administered.  The patient was appropriately positioned on the hospital bed with all bony prominences being meticulously padded.  Antibiotics were given.  The back was prepped and draped, and a time-out was performed.  A left-sided paramedian incision was made.  The fascia was incised, and a Wiltse approach was utilized.  The transverse processes of L3 and L4 were identified.  A self-retaining retractor was placed.  I then removed the intertransverse membrane.  The exiting L3 nerve was identified and noted to be under substantial tension.  I was able to  retract the exiting L3 nerve superiorly and laterally.  Immediately beneath it, a large L3-L4 disc herniation was readily identified and removed in 3 large fragments. In doing so, I was able to adequately and thoroughly decompress the L3 nerve.  Bleeding was controlled using Surgiflo.  I then injected 40 mg of Depo-Medrol in the region of the exiting L3 nerve.  The wound was then closed in layers using #1 Vicryl followed by 0 Vicryl, followed by 2-0 Vicryl.  Benzoin and Steri-Strips were applied followed by sterile dressing.  All instrument counts were correct.  Of note, I did use neurologic monitoring throughout the surgery, and there was no abnormal EMG activity noted throughout the entire surgery.  Of note, Pricilla Holm was my assistant throughout surgery and did aid in retraction, suctioning, and closure.     Phylliss Bob, MD     MD/MEDQ  D:  06/22/2015  T:  06/23/2015  Job:  [id number]

## 2015-07-06 DIAGNOSIS — M5136 Other intervertebral disc degeneration, lumbar region: Secondary | ICD-10-CM | POA: Diagnosis not present

## 2015-08-03 DIAGNOSIS — H4011X2 Primary open-angle glaucoma, moderate stage: Secondary | ICD-10-CM | POA: Diagnosis not present

## 2015-08-05 DIAGNOSIS — M5136 Other intervertebral disc degeneration, lumbar region: Secondary | ICD-10-CM | POA: Diagnosis not present

## 2015-08-12 DIAGNOSIS — Z961 Presence of intraocular lens: Secondary | ICD-10-CM | POA: Diagnosis not present

## 2015-09-13 DIAGNOSIS — H4011X1 Primary open-angle glaucoma, mild stage: Secondary | ICD-10-CM | POA: Diagnosis not present

## 2015-09-13 DIAGNOSIS — H35372 Puckering of macula, left eye: Secondary | ICD-10-CM | POA: Diagnosis not present

## 2015-09-13 DIAGNOSIS — Z961 Presence of intraocular lens: Secondary | ICD-10-CM | POA: Diagnosis not present

## 2015-09-13 DIAGNOSIS — H33023 Retinal detachment with multiple breaks, bilateral: Secondary | ICD-10-CM | POA: Diagnosis not present

## 2015-10-27 DIAGNOSIS — Z125 Encounter for screening for malignant neoplasm of prostate: Secondary | ICD-10-CM | POA: Diagnosis not present

## 2015-10-27 DIAGNOSIS — I10 Essential (primary) hypertension: Secondary | ICD-10-CM | POA: Diagnosis not present

## 2015-10-27 DIAGNOSIS — Z6833 Body mass index (BMI) 33.0-33.9, adult: Secondary | ICD-10-CM | POA: Diagnosis not present

## 2015-10-27 DIAGNOSIS — F339 Major depressive disorder, recurrent, unspecified: Secondary | ICD-10-CM | POA: Diagnosis not present

## 2015-10-27 DIAGNOSIS — E669 Obesity, unspecified: Secondary | ICD-10-CM | POA: Diagnosis not present

## 2015-10-27 DIAGNOSIS — E782 Mixed hyperlipidemia: Secondary | ICD-10-CM | POA: Diagnosis not present

## 2015-12-02 DIAGNOSIS — H401112 Primary open-angle glaucoma, right eye, moderate stage: Secondary | ICD-10-CM | POA: Diagnosis not present

## 2016-02-06 DIAGNOSIS — H401112 Primary open-angle glaucoma, right eye, moderate stage: Secondary | ICD-10-CM | POA: Diagnosis not present

## 2016-03-19 DIAGNOSIS — H401112 Primary open-angle glaucoma, right eye, moderate stage: Secondary | ICD-10-CM | POA: Diagnosis not present

## 2016-04-04 DIAGNOSIS — Z85828 Personal history of other malignant neoplasm of skin: Secondary | ICD-10-CM | POA: Diagnosis not present

## 2016-04-04 DIAGNOSIS — L812 Freckles: Secondary | ICD-10-CM | POA: Diagnosis not present

## 2016-04-30 DIAGNOSIS — H401131 Primary open-angle glaucoma, bilateral, mild stage: Secondary | ICD-10-CM | POA: Diagnosis not present

## 2016-04-30 DIAGNOSIS — H401112 Primary open-angle glaucoma, right eye, moderate stage: Secondary | ICD-10-CM | POA: Diagnosis not present

## 2016-05-02 DIAGNOSIS — E782 Mixed hyperlipidemia: Secondary | ICD-10-CM | POA: Diagnosis not present

## 2016-05-02 DIAGNOSIS — Z23 Encounter for immunization: Secondary | ICD-10-CM | POA: Diagnosis not present

## 2016-05-02 DIAGNOSIS — E669 Obesity, unspecified: Secondary | ICD-10-CM | POA: Diagnosis not present

## 2016-05-02 DIAGNOSIS — Z6833 Body mass index (BMI) 33.0-33.9, adult: Secondary | ICD-10-CM | POA: Diagnosis not present

## 2016-05-02 DIAGNOSIS — N400 Enlarged prostate without lower urinary tract symptoms: Secondary | ICD-10-CM | POA: Diagnosis not present

## 2016-05-02 DIAGNOSIS — I10 Essential (primary) hypertension: Secondary | ICD-10-CM | POA: Diagnosis not present

## 2016-05-02 DIAGNOSIS — F339 Major depressive disorder, recurrent, unspecified: Secondary | ICD-10-CM | POA: Diagnosis not present

## 2016-10-02 DIAGNOSIS — H401112 Primary open-angle glaucoma, right eye, moderate stage: Secondary | ICD-10-CM | POA: Diagnosis not present

## 2016-10-02 DIAGNOSIS — H35372 Puckering of macula, left eye: Secondary | ICD-10-CM | POA: Diagnosis not present

## 2016-10-02 DIAGNOSIS — H33023 Retinal detachment with multiple breaks, bilateral: Secondary | ICD-10-CM | POA: Diagnosis not present

## 2016-10-02 DIAGNOSIS — Z961 Presence of intraocular lens: Secondary | ICD-10-CM | POA: Diagnosis not present

## 2016-11-05 DIAGNOSIS — H401131 Primary open-angle glaucoma, bilateral, mild stage: Secondary | ICD-10-CM | POA: Diagnosis not present

## 2016-11-09 DIAGNOSIS — E669 Obesity, unspecified: Secondary | ICD-10-CM | POA: Diagnosis not present

## 2016-11-09 DIAGNOSIS — Z125 Encounter for screening for malignant neoplasm of prostate: Secondary | ICD-10-CM | POA: Diagnosis not present

## 2016-11-09 DIAGNOSIS — N400 Enlarged prostate without lower urinary tract symptoms: Secondary | ICD-10-CM | POA: Diagnosis not present

## 2016-11-09 DIAGNOSIS — I10 Essential (primary) hypertension: Secondary | ICD-10-CM | POA: Diagnosis not present

## 2016-11-09 DIAGNOSIS — F320 Major depressive disorder, single episode, mild: Secondary | ICD-10-CM | POA: Diagnosis not present

## 2016-11-09 DIAGNOSIS — E782 Mixed hyperlipidemia: Secondary | ICD-10-CM | POA: Diagnosis not present

## 2016-12-03 DIAGNOSIS — Z1211 Encounter for screening for malignant neoplasm of colon: Secondary | ICD-10-CM | POA: Diagnosis not present

## 2016-12-03 DIAGNOSIS — D126 Benign neoplasm of colon, unspecified: Secondary | ICD-10-CM | POA: Diagnosis not present

## 2016-12-03 DIAGNOSIS — K635 Polyp of colon: Secondary | ICD-10-CM | POA: Diagnosis not present

## 2016-12-03 DIAGNOSIS — D120 Benign neoplasm of cecum: Secondary | ICD-10-CM | POA: Diagnosis not present

## 2016-12-03 DIAGNOSIS — D125 Benign neoplasm of sigmoid colon: Secondary | ICD-10-CM | POA: Diagnosis not present

## 2016-12-03 DIAGNOSIS — D123 Benign neoplasm of transverse colon: Secondary | ICD-10-CM | POA: Diagnosis not present

## 2016-12-03 DIAGNOSIS — D122 Benign neoplasm of ascending colon: Secondary | ICD-10-CM | POA: Diagnosis not present

## 2016-12-07 DIAGNOSIS — K635 Polyp of colon: Secondary | ICD-10-CM | POA: Diagnosis not present

## 2016-12-07 DIAGNOSIS — Z1211 Encounter for screening for malignant neoplasm of colon: Secondary | ICD-10-CM | POA: Diagnosis not present

## 2016-12-07 DIAGNOSIS — D126 Benign neoplasm of colon, unspecified: Secondary | ICD-10-CM | POA: Diagnosis not present

## 2017-01-28 DIAGNOSIS — N342 Other urethritis: Secondary | ICD-10-CM | POA: Diagnosis not present

## 2017-01-28 DIAGNOSIS — R319 Hematuria, unspecified: Secondary | ICD-10-CM | POA: Diagnosis not present

## 2017-02-14 DIAGNOSIS — R319 Hematuria, unspecified: Secondary | ICD-10-CM | POA: Diagnosis not present

## 2017-02-15 DIAGNOSIS — H401113 Primary open-angle glaucoma, right eye, severe stage: Secondary | ICD-10-CM | POA: Diagnosis not present

## 2017-05-06 DIAGNOSIS — H401113 Primary open-angle glaucoma, right eye, severe stage: Secondary | ICD-10-CM | POA: Diagnosis not present

## 2017-05-24 DIAGNOSIS — N400 Enlarged prostate without lower urinary tract symptoms: Secondary | ICD-10-CM | POA: Diagnosis not present

## 2017-05-24 DIAGNOSIS — E669 Obesity, unspecified: Secondary | ICD-10-CM | POA: Diagnosis not present

## 2017-05-24 DIAGNOSIS — Z1159 Encounter for screening for other viral diseases: Secondary | ICD-10-CM | POA: Diagnosis not present

## 2017-05-24 DIAGNOSIS — F339 Major depressive disorder, recurrent, unspecified: Secondary | ICD-10-CM | POA: Diagnosis not present

## 2017-05-24 DIAGNOSIS — E782 Mixed hyperlipidemia: Secondary | ICD-10-CM | POA: Diagnosis not present

## 2017-05-24 DIAGNOSIS — I10 Essential (primary) hypertension: Secondary | ICD-10-CM | POA: Diagnosis not present

## 2017-06-19 DIAGNOSIS — H401113 Primary open-angle glaucoma, right eye, severe stage: Secondary | ICD-10-CM | POA: Diagnosis not present

## 2017-10-24 DIAGNOSIS — H401113 Primary open-angle glaucoma, right eye, severe stage: Secondary | ICD-10-CM | POA: Diagnosis not present

## 2017-11-27 DIAGNOSIS — Z125 Encounter for screening for malignant neoplasm of prostate: Secondary | ICD-10-CM | POA: Diagnosis not present

## 2017-11-27 DIAGNOSIS — I10 Essential (primary) hypertension: Secondary | ICD-10-CM | POA: Diagnosis not present

## 2017-11-27 DIAGNOSIS — F320 Major depressive disorder, single episode, mild: Secondary | ICD-10-CM | POA: Diagnosis not present

## 2017-11-27 DIAGNOSIS — N400 Enlarged prostate without lower urinary tract symptoms: Secondary | ICD-10-CM | POA: Diagnosis not present

## 2017-11-27 DIAGNOSIS — E669 Obesity, unspecified: Secondary | ICD-10-CM | POA: Diagnosis not present

## 2017-11-27 DIAGNOSIS — D126 Benign neoplasm of colon, unspecified: Secondary | ICD-10-CM | POA: Diagnosis not present

## 2017-11-27 DIAGNOSIS — E782 Mixed hyperlipidemia: Secondary | ICD-10-CM | POA: Diagnosis not present

## 2017-12-03 DIAGNOSIS — H33023 Retinal detachment with multiple breaks, bilateral: Secondary | ICD-10-CM | POA: Diagnosis not present

## 2017-12-03 DIAGNOSIS — Z961 Presence of intraocular lens: Secondary | ICD-10-CM | POA: Diagnosis not present

## 2017-12-03 DIAGNOSIS — H401131 Primary open-angle glaucoma, bilateral, mild stage: Secondary | ICD-10-CM | POA: Diagnosis not present

## 2017-12-03 DIAGNOSIS — H35372 Puckering of macula, left eye: Secondary | ICD-10-CM | POA: Diagnosis not present

## 2018-02-27 DIAGNOSIS — H401113 Primary open-angle glaucoma, right eye, severe stage: Secondary | ICD-10-CM | POA: Diagnosis not present

## 2018-04-21 DIAGNOSIS — K5730 Diverticulosis of large intestine without perforation or abscess without bleeding: Secondary | ICD-10-CM | POA: Diagnosis not present

## 2018-04-21 DIAGNOSIS — D126 Benign neoplasm of colon, unspecified: Secondary | ICD-10-CM | POA: Diagnosis not present

## 2018-04-21 DIAGNOSIS — K648 Other hemorrhoids: Secondary | ICD-10-CM | POA: Diagnosis not present

## 2018-04-21 DIAGNOSIS — Z86010 Personal history of colonic polyps: Secondary | ICD-10-CM | POA: Diagnosis not present

## 2018-04-21 DIAGNOSIS — K635 Polyp of colon: Secondary | ICD-10-CM | POA: Diagnosis not present

## 2018-04-21 DIAGNOSIS — K5520 Angiodysplasia of colon without hemorrhage: Secondary | ICD-10-CM | POA: Diagnosis not present

## 2018-04-24 DIAGNOSIS — K635 Polyp of colon: Secondary | ICD-10-CM | POA: Diagnosis not present

## 2018-04-24 DIAGNOSIS — D126 Benign neoplasm of colon, unspecified: Secondary | ICD-10-CM | POA: Diagnosis not present

## 2018-05-27 DIAGNOSIS — E782 Mixed hyperlipidemia: Secondary | ICD-10-CM | POA: Diagnosis not present

## 2018-05-27 DIAGNOSIS — F339 Major depressive disorder, recurrent, unspecified: Secondary | ICD-10-CM | POA: Diagnosis not present

## 2018-05-27 DIAGNOSIS — E669 Obesity, unspecified: Secondary | ICD-10-CM | POA: Diagnosis not present

## 2018-05-27 DIAGNOSIS — N400 Enlarged prostate without lower urinary tract symptoms: Secondary | ICD-10-CM | POA: Diagnosis not present

## 2018-05-27 DIAGNOSIS — I10 Essential (primary) hypertension: Secondary | ICD-10-CM | POA: Diagnosis not present

## 2018-08-27 DIAGNOSIS — H401112 Primary open-angle glaucoma, right eye, moderate stage: Secondary | ICD-10-CM | POA: Diagnosis not present

## 2018-09-25 DIAGNOSIS — H401113 Primary open-angle glaucoma, right eye, severe stage: Secondary | ICD-10-CM | POA: Diagnosis not present

## 2018-10-22 DIAGNOSIS — H401113 Primary open-angle glaucoma, right eye, severe stage: Secondary | ICD-10-CM | POA: Diagnosis not present

## 2018-12-01 DIAGNOSIS — E669 Obesity, unspecified: Secondary | ICD-10-CM | POA: Diagnosis not present

## 2018-12-01 DIAGNOSIS — E782 Mixed hyperlipidemia: Secondary | ICD-10-CM | POA: Diagnosis not present

## 2018-12-01 DIAGNOSIS — I10 Essential (primary) hypertension: Secondary | ICD-10-CM | POA: Diagnosis not present

## 2018-12-01 DIAGNOSIS — N400 Enlarged prostate without lower urinary tract symptoms: Secondary | ICD-10-CM | POA: Diagnosis not present

## 2018-12-01 DIAGNOSIS — F320 Major depressive disorder, single episode, mild: Secondary | ICD-10-CM | POA: Diagnosis not present

## 2018-12-01 DIAGNOSIS — Z23 Encounter for immunization: Secondary | ICD-10-CM | POA: Diagnosis not present

## 2019-01-16 DIAGNOSIS — H401113 Primary open-angle glaucoma, right eye, severe stage: Secondary | ICD-10-CM | POA: Diagnosis not present

## 2019-01-22 DIAGNOSIS — H401113 Primary open-angle glaucoma, right eye, severe stage: Secondary | ICD-10-CM | POA: Diagnosis not present

## 2019-02-16 DIAGNOSIS — H401113 Primary open-angle glaucoma, right eye, severe stage: Secondary | ICD-10-CM | POA: Diagnosis not present

## 2019-03-16 DIAGNOSIS — H401121 Primary open-angle glaucoma, left eye, mild stage: Secondary | ICD-10-CM | POA: Diagnosis not present

## 2019-03-16 DIAGNOSIS — H401113 Primary open-angle glaucoma, right eye, severe stage: Secondary | ICD-10-CM | POA: Diagnosis not present

## 2019-06-02 DIAGNOSIS — E782 Mixed hyperlipidemia: Secondary | ICD-10-CM | POA: Diagnosis not present

## 2019-06-02 DIAGNOSIS — I10 Essential (primary) hypertension: Secondary | ICD-10-CM | POA: Diagnosis not present

## 2019-06-02 DIAGNOSIS — F320 Major depressive disorder, single episode, mild: Secondary | ICD-10-CM | POA: Diagnosis not present

## 2019-06-02 DIAGNOSIS — N400 Enlarged prostate without lower urinary tract symptoms: Secondary | ICD-10-CM | POA: Diagnosis not present

## 2019-06-02 DIAGNOSIS — E669 Obesity, unspecified: Secondary | ICD-10-CM | POA: Diagnosis not present

## 2019-06-09 DIAGNOSIS — H33022 Retinal detachment with multiple breaks, left eye: Secondary | ICD-10-CM | POA: Diagnosis not present

## 2019-06-09 DIAGNOSIS — H401113 Primary open-angle glaucoma, right eye, severe stage: Secondary | ICD-10-CM | POA: Diagnosis not present

## 2019-06-09 DIAGNOSIS — H401121 Primary open-angle glaucoma, left eye, mild stage: Secondary | ICD-10-CM | POA: Diagnosis not present

## 2019-06-09 DIAGNOSIS — H35372 Puckering of macula, left eye: Secondary | ICD-10-CM | POA: Diagnosis not present

## 2019-06-09 DIAGNOSIS — Z961 Presence of intraocular lens: Secondary | ICD-10-CM | POA: Diagnosis not present

## 2019-06-25 DIAGNOSIS — H401113 Primary open-angle glaucoma, right eye, severe stage: Secondary | ICD-10-CM | POA: Diagnosis not present

## 2019-06-25 DIAGNOSIS — H401121 Primary open-angle glaucoma, left eye, mild stage: Secondary | ICD-10-CM | POA: Diagnosis not present

## 2019-07-06 DIAGNOSIS — H401113 Primary open-angle glaucoma, right eye, severe stage: Secondary | ICD-10-CM | POA: Diagnosis not present

## 2019-07-06 DIAGNOSIS — H401121 Primary open-angle glaucoma, left eye, mild stage: Secondary | ICD-10-CM | POA: Diagnosis not present

## 2019-08-31 DIAGNOSIS — H401121 Primary open-angle glaucoma, left eye, mild stage: Secondary | ICD-10-CM | POA: Diagnosis not present

## 2019-08-31 DIAGNOSIS — H401113 Primary open-angle glaucoma, right eye, severe stage: Secondary | ICD-10-CM | POA: Diagnosis not present

## 2019-11-30 DIAGNOSIS — H401121 Primary open-angle glaucoma, left eye, mild stage: Secondary | ICD-10-CM | POA: Diagnosis not present

## 2019-11-30 DIAGNOSIS — H401113 Primary open-angle glaucoma, right eye, severe stage: Secondary | ICD-10-CM | POA: Diagnosis not present

## 2020-02-18 ENCOUNTER — Observation Stay (HOSPITAL_COMMUNITY): Payer: Medicare Other | Admitting: Anesthesiology

## 2020-02-18 ENCOUNTER — Emergency Department (HOSPITAL_COMMUNITY): Payer: Medicare Other

## 2020-02-18 ENCOUNTER — Other Ambulatory Visit: Payer: Self-pay

## 2020-02-18 ENCOUNTER — Encounter (HOSPITAL_COMMUNITY): Payer: Self-pay | Admitting: Emergency Medicine

## 2020-02-18 ENCOUNTER — Encounter (HOSPITAL_COMMUNITY): Admission: EM | Disposition: A | Discharge status: Home / Self Care | Payer: Self-pay | Source: Home / Self Care

## 2020-02-18 ENCOUNTER — Inpatient Hospital Stay (HOSPITAL_COMMUNITY)
Admission: EM | Admit: 2020-02-18 | Discharge: 2020-03-05 | DRG: 330 | Disposition: A | Discharge status: Home / Self Care | Payer: Medicare Other | Attending: Surgery | Admitting: Surgery

## 2020-02-18 DIAGNOSIS — K5641 Fecal impaction: Secondary | ICD-10-CM | POA: Diagnosis present

## 2020-02-18 DIAGNOSIS — K3532 Acute appendicitis with perforation and localized peritonitis, without abscess: Secondary | ICD-10-CM | POA: Diagnosis present

## 2020-02-18 DIAGNOSIS — K352 Acute appendicitis with generalized peritonitis, without abscess: Secondary | ICD-10-CM | POA: Diagnosis not present

## 2020-02-18 DIAGNOSIS — Z87891 Personal history of nicotine dependence: Secondary | ICD-10-CM | POA: Diagnosis not present

## 2020-02-18 DIAGNOSIS — Z20822 Contact with and (suspected) exposure to covid-19: Secondary | ICD-10-CM | POA: Diagnosis present

## 2020-02-18 DIAGNOSIS — R338 Other retention of urine: Secondary | ICD-10-CM | POA: Diagnosis present

## 2020-02-18 DIAGNOSIS — K56609 Unspecified intestinal obstruction, unspecified as to partial versus complete obstruction: Secondary | ICD-10-CM | POA: Diagnosis not present

## 2020-02-18 DIAGNOSIS — R110 Nausea: Secondary | ICD-10-CM | POA: Diagnosis not present

## 2020-02-18 DIAGNOSIS — N401 Enlarged prostate with lower urinary tract symptoms: Secondary | ICD-10-CM | POA: Diagnosis present

## 2020-02-18 DIAGNOSIS — K353 Acute appendicitis with localized peritonitis, without perforation or gangrene: Secondary | ICD-10-CM

## 2020-02-18 DIAGNOSIS — I10 Essential (primary) hypertension: Secondary | ICD-10-CM | POA: Diagnosis present

## 2020-02-18 DIAGNOSIS — E876 Hypokalemia: Secondary | ICD-10-CM | POA: Diagnosis not present

## 2020-02-18 DIAGNOSIS — K567 Ileus, unspecified: Secondary | ICD-10-CM | POA: Diagnosis not present

## 2020-02-18 DIAGNOSIS — R0902 Hypoxemia: Secondary | ICD-10-CM | POA: Diagnosis not present

## 2020-02-18 DIAGNOSIS — K5730 Diverticulosis of large intestine without perforation or abscess without bleeding: Secondary | ICD-10-CM | POA: Diagnosis not present

## 2020-02-18 DIAGNOSIS — G8929 Other chronic pain: Secondary | ICD-10-CM | POA: Diagnosis present

## 2020-02-18 DIAGNOSIS — E440 Moderate protein-calorie malnutrition: Secondary | ICD-10-CM | POA: Diagnosis present

## 2020-02-18 DIAGNOSIS — N200 Calculus of kidney: Secondary | ICD-10-CM | POA: Diagnosis not present

## 2020-02-18 DIAGNOSIS — K3580 Unspecified acute appendicitis: Secondary | ICD-10-CM | POA: Diagnosis not present

## 2020-02-18 DIAGNOSIS — Z4682 Encounter for fitting and adjustment of non-vascular catheter: Secondary | ICD-10-CM | POA: Diagnosis not present

## 2020-02-18 DIAGNOSIS — E785 Hyperlipidemia, unspecified: Secondary | ICD-10-CM | POA: Diagnosis present

## 2020-02-18 DIAGNOSIS — Z6833 Body mass index (BMI) 33.0-33.9, adult: Secondary | ICD-10-CM

## 2020-02-18 DIAGNOSIS — E739 Lactose intolerance, unspecified: Secondary | ICD-10-CM | POA: Diagnosis present

## 2020-02-18 DIAGNOSIS — E669 Obesity, unspecified: Secondary | ICD-10-CM | POA: Diagnosis present

## 2020-02-18 DIAGNOSIS — J90 Pleural effusion, not elsewhere classified: Secondary | ICD-10-CM | POA: Diagnosis not present

## 2020-02-18 DIAGNOSIS — R109 Unspecified abdominal pain: Secondary | ICD-10-CM | POA: Diagnosis not present

## 2020-02-18 DIAGNOSIS — K5900 Constipation, unspecified: Secondary | ICD-10-CM | POA: Diagnosis not present

## 2020-02-18 DIAGNOSIS — K7689 Other specified diseases of liver: Secondary | ICD-10-CM | POA: Diagnosis not present

## 2020-02-18 DIAGNOSIS — K651 Peritoneal abscess: Secondary | ICD-10-CM | POA: Diagnosis not present

## 2020-02-18 DIAGNOSIS — J9811 Atelectasis: Secondary | ICD-10-CM | POA: Diagnosis not present

## 2020-02-18 DIAGNOSIS — T8143XA Infection following a procedure, organ and space surgical site, initial encounter: Secondary | ICD-10-CM

## 2020-02-18 DIAGNOSIS — J449 Chronic obstructive pulmonary disease, unspecified: Secondary | ICD-10-CM | POA: Diagnosis present

## 2020-02-18 DIAGNOSIS — M549 Dorsalgia, unspecified: Secondary | ICD-10-CM | POA: Diagnosis present

## 2020-02-18 DIAGNOSIS — R1110 Vomiting, unspecified: Secondary | ICD-10-CM

## 2020-02-18 DIAGNOSIS — R10813 Right lower quadrant abdominal tenderness: Secondary | ICD-10-CM | POA: Diagnosis not present

## 2020-02-18 DIAGNOSIS — R140 Abdominal distension (gaseous): Secondary | ICD-10-CM | POA: Diagnosis not present

## 2020-02-18 DIAGNOSIS — R1084 Generalized abdominal pain: Secondary | ICD-10-CM | POA: Diagnosis not present

## 2020-02-18 HISTORY — DX: Calculus of kidney: N20.0

## 2020-02-18 HISTORY — PX: LAPAROSCOPIC APPENDECTOMY: SHX408

## 2020-02-18 LAB — COMPREHENSIVE METABOLIC PANEL
ALT: 17 U/L (ref 0–44)
AST: 19 U/L (ref 15–41)
Albumin: 3.7 g/dL (ref 3.5–5.0)
Alkaline Phosphatase: 54 U/L (ref 38–126)
Anion gap: 11 (ref 5–15)
BUN: 16 mg/dL (ref 8–23)
CO2: 19 mmol/L — ABNORMAL LOW (ref 22–32)
Calcium: 8 mg/dL — ABNORMAL LOW (ref 8.9–10.3)
Chloride: 108 mmol/L (ref 98–111)
Creatinine, Ser: 1.2 mg/dL (ref 0.61–1.24)
GFR calc Af Amer: >60 mL/min (ref >60)
GFR calc non Af Amer: >60 mL/min (ref >60)
Glucose, Bld: 129 mg/dL — ABNORMAL HIGH (ref 70–99)
Potassium: 3.5 mmol/L (ref 3.5–5.1)
Sodium: 138 mmol/L (ref 135–145)
Total Bilirubin: 1.2 mg/dL (ref 0.3–1.2)
Total Protein: 6.1 g/dL — ABNORMAL LOW (ref 6.5–8.1)

## 2020-02-18 LAB — CBC WITH DIFFERENTIAL/PLATELET
Abs Immature Granulocytes: 0.03 10*3/uL (ref 0.00–0.07)
Basophils Absolute: 0.1 10*3/uL (ref 0.0–0.1)
Basophils Relative: 0 %
Eosinophils Absolute: 0 10*3/uL (ref 0.0–0.5)
Eosinophils Relative: 0 %
HCT: 47.8 % (ref 39.0–52.0)
Hemoglobin: 15.4 g/dL (ref 13.0–17.0)
Immature Granulocytes: 0 %
Lymphocytes Relative: 11 %
Lymphs Abs: 1.2 10*3/uL (ref 0.7–4.0)
MCH: 31.8 pg (ref 26.0–34.0)
MCHC: 32.2 g/dL (ref 30.0–36.0)
MCV: 98.8 fL (ref 80.0–100.0)
Monocytes Absolute: 0.6 10*3/uL (ref 0.1–1.0)
Monocytes Relative: 5 %
Neutro Abs: 9.6 10*3/uL — ABNORMAL HIGH (ref 1.7–7.7)
Neutrophils Relative %: 84 %
Platelets: 224 10*3/uL (ref 150–400)
RBC: 4.84 MIL/uL (ref 4.22–5.81)
RDW: 13 % (ref 11.5–15.5)
WBC: 11.5 10*3/uL — ABNORMAL HIGH (ref 4.0–10.5)
nRBC: 0 % (ref 0.0–0.2)

## 2020-02-18 LAB — LIPASE, BLOOD: Lipase: 26 U/L (ref 11–51)

## 2020-02-18 LAB — HIV ANTIBODY (ROUTINE TESTING W REFLEX): HIV Screen 4th Generation wRfx: NONREACTIVE

## 2020-02-18 LAB — RESPIRATORY PANEL BY RT PCR (FLU A&B, COVID)
Influenza A by PCR: NEGATIVE
Influenza B by PCR: NEGATIVE
SARS Coronavirus 2 by RT PCR: NEGATIVE

## 2020-02-18 SURGERY — APPENDECTOMY, LAPAROSCOPIC
Anesthesia: General

## 2020-02-18 MED ORDER — PHENYLEPHRINE 40 MCG/ML (10ML) SYRINGE FOR IV PUSH (FOR BLOOD PRESSURE SUPPORT)
PREFILLED_SYRINGE | INTRAVENOUS | Status: AC
  Filled 2020-02-18: qty 10

## 2020-02-18 MED ORDER — FENTANYL CITRATE (PF) 100 MCG/2ML IJ SOLN
25.0000 ug | INTRAMUSCULAR | Status: DC | PRN
Start: 2020-02-18 — End: 2020-02-18
  Administered 2020-02-18: 25 ug via INTRAVENOUS
  Filled 2020-02-18: qty 2

## 2020-02-18 MED ORDER — PIPERACILLIN-TAZOBACTAM 3.375 G IVPB
3.3750 g | Freq: Three times a day (TID) | INTRAVENOUS | Status: AC
  Administered 2020-02-18 – 2020-03-02 (×40): 3.375 g via INTRAVENOUS
  Filled 2020-02-18 (×40): qty 50

## 2020-02-18 MED ORDER — DIPHENHYDRAMINE HCL 12.5 MG/5ML PO ELIX: 12.5000 mg | ORAL_SOLUTION | Freq: Four times a day (QID) | ORAL | Status: DC | PRN

## 2020-02-18 MED ORDER — BUPIVACAINE-EPINEPHRINE 0.25% -1:200000 IJ SOLN
INTRAMUSCULAR | Status: DC | PRN
  Administered 2020-02-18: 6 mL

## 2020-02-18 MED ORDER — ONDANSETRON HCL 4 MG/2ML IJ SOLN
4.0000 mg | Freq: Once | INTRAMUSCULAR | Status: AC
Start: 2020-02-18 — End: 2020-02-18
  Administered 2020-02-18: 14:00:00 4 mg via INTRAVENOUS
  Filled 2020-02-18: qty 2

## 2020-02-18 MED ORDER — PIPERACILLIN-TAZOBACTAM 3.375 G IVPB: 3.3750 g | Freq: Three times a day (TID) | INTRAVENOUS | Status: DC

## 2020-02-18 MED ORDER — NETARSUDIL-LATANOPROST 0.02-0.005 % OP SOLN
1.0000 [drp] | Freq: Every day | OPHTHALMIC | Status: DC
  Administered 2020-02-22 – 2020-03-05 (×10): 1 [drp] via OPHTHALMIC

## 2020-02-18 MED ORDER — MORPHINE SULFATE (PF) 2 MG/ML IV SOLN: 2.0000 mg | INTRAVENOUS | Status: DC | PRN

## 2020-02-18 MED ORDER — BUPIVACAINE HCL (PF) 0.25 % IJ SOLN
INTRAMUSCULAR | Status: AC
  Filled 2020-02-18: qty 30

## 2020-02-18 MED ORDER — DEXAMETHASONE SODIUM PHOSPHATE 10 MG/ML IJ SOLN
INTRAMUSCULAR | Status: DC | PRN
  Administered 2020-02-18: 5 mg via INTRAVENOUS

## 2020-02-18 MED ORDER — METRONIDAZOLE IN NACL 5-0.79 MG/ML-% IV SOLN
500.0000 mg | Freq: Once | INTRAVENOUS | Status: AC
  Administered 2020-02-18: 17:00:00 500 mg via INTRAVENOUS
  Filled 2020-02-18: qty 100

## 2020-02-18 MED ORDER — FENOFIBRATE 160 MG PO TABS: 160.0000 mg | ORAL_TABLET | Freq: Every day | ORAL | Status: DC

## 2020-02-18 MED ORDER — PROPOFOL 10 MG/ML IV BOLUS
INTRAVENOUS | Status: DC | PRN
  Administered 2020-02-18: 150 mg via INTRAVENOUS

## 2020-02-18 MED ORDER — IOHEXOL 300 MG/ML  SOLN
100.0000 mL | Freq: Once | INTRAMUSCULAR | Status: AC | PRN
  Administered 2020-02-18: 16:00:00 100 mL via INTRAVENOUS

## 2020-02-18 MED ORDER — IBUPROFEN 600 MG PO TABS: 600.0000 mg | ORAL_TABLET | Freq: Four times a day (QID) | ORAL | Status: DC | PRN

## 2020-02-18 MED ORDER — FENTANYL CITRATE (PF) 250 MCG/5ML IJ SOLN
INTRAMUSCULAR | Status: AC
  Filled 2020-02-18: qty 5

## 2020-02-18 MED ORDER — PHENYLEPHRINE HCL-NACL 10-0.9 MG/250ML-% IV SOLN
INTRAVENOUS | Status: DC | PRN
  Administered 2020-02-18: 20 ug/min via INTRAVENOUS

## 2020-02-18 MED ORDER — LATANOPROST 0.005 % OP SOLN
1.0000 [drp] | Freq: Every day | OPHTHALMIC | Status: DC
  Administered 2020-02-18: 1 [drp] via OPHTHALMIC
  Filled 2020-02-18: qty 2.5

## 2020-02-18 MED ORDER — LIDOCAINE 2% (20 MG/ML) 5 ML SYRINGE
INTRAMUSCULAR | Status: AC
  Filled 2020-02-18: qty 5

## 2020-02-18 MED ORDER — SUCCINYLCHOLINE CHLORIDE 200 MG/10ML IV SOSY
PREFILLED_SYRINGE | INTRAVENOUS | Status: AC
  Filled 2020-02-18: qty 10

## 2020-02-18 MED ORDER — SUGAMMADEX SODIUM 200 MG/2ML IV SOLN
INTRAVENOUS | Status: DC | PRN
  Administered 2020-02-18 (×2): 100 mg via INTRAVENOUS

## 2020-02-18 MED ORDER — LACTATED RINGERS IV SOLN: INTRAVENOUS | Status: DC | PRN

## 2020-02-18 MED ORDER — ROCURONIUM BROMIDE 10 MG/ML (PF) SYRINGE
PREFILLED_SYRINGE | INTRAVENOUS | Status: AC
  Filled 2020-02-18: qty 10

## 2020-02-18 MED ORDER — TRAMADOL HCL 50 MG PO TABS
50.0000 mg | ORAL_TABLET | Freq: Four times a day (QID) | ORAL | Status: DC | PRN
  Administered 2020-02-19: 50 mg via ORAL
  Filled 2020-02-18: qty 1

## 2020-02-18 MED ORDER — PROMETHAZINE HCL 25 MG/ML IJ SOLN: 6.2500 mg | INTRAMUSCULAR | Status: DC | PRN

## 2020-02-18 MED ORDER — DORZOLAMIDE HCL-TIMOLOL MAL 2-0.5 % OP SOLN
1.0000 [drp] | Freq: Two times a day (BID) | OPHTHALMIC | Status: DC
  Administered 2020-02-18 – 2020-03-05 (×32): 1 [drp] via OPHTHALMIC
  Filled 2020-02-18: qty 10

## 2020-02-18 MED ORDER — VASOPRESSIN 20 UNIT/ML IV SOLN
INTRAVENOUS | Status: AC
  Filled 2020-02-18: qty 1

## 2020-02-18 MED ORDER — LACTATED RINGERS IV SOLN: INTRAVENOUS | Status: DC

## 2020-02-18 MED ORDER — DIPHENHYDRAMINE HCL 50 MG/ML IJ SOLN: 12.5000 mg | Freq: Four times a day (QID) | INTRAMUSCULAR | Status: DC | PRN

## 2020-02-18 MED ORDER — CEFTRIAXONE SODIUM 2 G IJ SOLR
2.0000 g | Freq: Once | INTRAMUSCULAR | Status: AC
  Administered 2020-02-18: 17:00:00 2 g via INTRAVENOUS
  Filled 2020-02-18: qty 20

## 2020-02-18 MED ORDER — KETOROLAC TROMETHAMINE 30 MG/ML IJ SOLN: 15.0000 mg | Freq: Once | INTRAMUSCULAR | Status: DC | PRN

## 2020-02-18 MED ORDER — FENTANYL CITRATE (PF) 100 MCG/2ML IJ SOLN: 25.0000 ug | INTRAMUSCULAR | Status: DC | PRN

## 2020-02-18 MED ORDER — HEPARIN SODIUM (PORCINE) 5000 UNIT/ML IJ SOLN
5000.0000 [IU] | Freq: Three times a day (TID) | INTRAMUSCULAR | Status: DC
  Administered 2020-02-19 – 2020-02-25 (×20): 5000 [IU] via SUBCUTANEOUS
  Filled 2020-02-18 (×20): qty 1

## 2020-02-18 MED ORDER — ONDANSETRON 4 MG PO TBDP: 4.0000 mg | ORAL_TABLET | Freq: Four times a day (QID) | ORAL | Status: DC | PRN

## 2020-02-18 MED ORDER — ACETAMINOPHEN 500 MG PO TABS
1000.0000 mg | ORAL_TABLET | Freq: Four times a day (QID) | ORAL | Status: DC
  Administered 2020-02-18 – 2020-02-19 (×2): 1000 mg via ORAL
  Filled 2020-02-18 (×2): qty 2

## 2020-02-18 MED ORDER — PHENYLEPHRINE HCL-NACL 10-0.9 MG/250ML-% IV SOLN
INTRAVENOUS | Status: AC
  Filled 2020-02-18: qty 250

## 2020-02-18 MED ORDER — VITAMIN D 25 MCG (1000 UNIT) PO TABS
3000.0000 [IU] | ORAL_TABLET | Freq: Every day | ORAL | Status: DC
  Administered 2020-02-19 – 2020-02-22 (×3): 3000 [IU] via ORAL
  Filled 2020-02-18 (×4): qty 3

## 2020-02-18 MED ORDER — FENTANYL CITRATE (PF) 100 MCG/2ML IJ SOLN
INTRAMUSCULAR | Status: DC | PRN
  Administered 2020-02-18: 50 ug via INTRAVENOUS
  Administered 2020-02-18: 100 ug via INTRAVENOUS
  Administered 2020-02-18 (×3): 50 ug via INTRAVENOUS

## 2020-02-18 MED ORDER — DEXAMETHASONE SODIUM PHOSPHATE 10 MG/ML IJ SOLN
INTRAMUSCULAR | Status: AC
  Filled 2020-02-18: qty 1

## 2020-02-18 MED ORDER — SODIUM CHLORIDE 0.9 % IV SOLN: INTRAVENOUS | Status: DC

## 2020-02-18 MED ORDER — ONDANSETRON HCL 4 MG/2ML IJ SOLN
4.0000 mg | Freq: Four times a day (QID) | INTRAMUSCULAR | Status: DC | PRN
  Administered 2020-02-20 – 2020-03-01 (×3): 4 mg via INTRAVENOUS
  Filled 2020-02-18 (×3): qty 2

## 2020-02-18 MED ORDER — LIDOCAINE 2% (20 MG/ML) 5 ML SYRINGE
INTRAMUSCULAR | Status: DC | PRN
  Administered 2020-02-18: 100 mg via INTRAVENOUS

## 2020-02-18 MED ORDER — ROCURONIUM BROMIDE 100 MG/10ML IV SOLN
INTRAVENOUS | Status: DC | PRN
  Administered 2020-02-18: 10 mg via INTRAVENOUS
  Administered 2020-02-18: 50 mg via INTRAVENOUS
  Administered 2020-02-18: 20 mg via INTRAVENOUS

## 2020-02-18 MED ORDER — ONDANSETRON HCL 4 MG/2ML IJ SOLN
INTRAMUSCULAR | Status: DC | PRN
  Administered 2020-02-18: 4 mg via INTRAVENOUS

## 2020-02-18 MED ORDER — 0.9 % SODIUM CHLORIDE (POUR BTL) OPTIME
TOPICAL | Status: DC | PRN
  Administered 2020-02-18: 19:00:00 1000 mL

## 2020-02-18 MED ORDER — PHENYLEPHRINE HCL (PRESSORS) 10 MG/ML IV SOLN
INTRAVENOUS | Status: DC | PRN
  Administered 2020-02-18: 80 ug via INTRAVENOUS
  Administered 2020-02-18: 120 ug via INTRAVENOUS

## 2020-02-18 MED ORDER — DOCUSATE SODIUM 100 MG PO CAPS
200.0000 mg | ORAL_CAPSULE | Freq: Two times a day (BID) | ORAL | Status: DC
  Administered 2020-02-19 – 2020-02-27 (×13): 200 mg via ORAL
  Filled 2020-02-18 (×13): qty 2

## 2020-02-18 MED ORDER — PAROXETINE HCL ER 12.5 MG PO TB24
25.0000 mg | ORAL_TABLET | Freq: Every morning | ORAL | Status: DC
  Administered 2020-02-21 – 2020-03-05 (×14): 25 mg via ORAL
  Filled 2020-02-18: qty 2
  Filled 2020-02-18: qty 1
  Filled 2020-02-18 (×5): qty 2
  Filled 2020-02-18: qty 1
  Filled 2020-02-18 (×8): qty 2
  Filled 2020-02-18: qty 1
  Filled 2020-02-18: qty 2

## 2020-02-18 MED ORDER — HYDROMORPHONE HCL 1 MG/ML IJ SOLN
1.0000 mg | Freq: Once | INTRAMUSCULAR | Status: AC
  Administered 2020-02-18: 14:00:00 1 mg via INTRAVENOUS
  Filled 2020-02-18: qty 1

## 2020-02-18 MED ORDER — SIMETHICONE 80 MG PO CHEW: 40.0000 mg | CHEWABLE_TABLET | Freq: Four times a day (QID) | ORAL | Status: DC | PRN

## 2020-02-18 MED ORDER — ONDANSETRON HCL 4 MG/2ML IJ SOLN
INTRAMUSCULAR | Status: AC
  Filled 2020-02-18: qty 2

## 2020-02-18 MED ORDER — LISINOPRIL 20 MG PO TABS: 20.0000 mg | ORAL_TABLET | Freq: Every day | ORAL | Status: DC

## 2020-02-18 MED ORDER — SUCCINYLCHOLINE 20MG/ML (10ML) SYRINGE FOR MEDFUSION PUMP - OPTIME
INTRAMUSCULAR | Status: DC | PRN
  Administered 2020-02-18: 120 mg via INTRAVENOUS

## 2020-02-18 MED ORDER — KCL IN DEXTROSE-NACL 40-5-0.9 MEQ/L-%-% IV SOLN
INTRAVENOUS | Status: DC
  Filled 2020-02-18: qty 1000

## 2020-02-18 MED ORDER — HYDROMORPHONE HCL 1 MG/ML IJ SOLN: 0.5000 mg | INTRAMUSCULAR | Status: DC | PRN

## 2020-02-18 SURGICAL SUPPLY — 44 items
APPLIER CLIP 5 13 M/L LIGAMAX5 (MISCELLANEOUS)
BLADE CLIPPER SURG (BLADE) ×2 IMPLANT
CANISTER SUCT 3000ML PPV (MISCELLANEOUS) ×2 IMPLANT
CHLORAPREP W/TINT 26 (MISCELLANEOUS) ×2 IMPLANT
CLIP APPLIE 5 13 M/L LIGAMAX5 (MISCELLANEOUS) IMPLANT
COVER SURGICAL LIGHT HANDLE (MISCELLANEOUS) ×2 IMPLANT
COVER WAND RF STERILE (DRAPES) ×2 IMPLANT
CUTTER FLEX LINEAR 45M (STAPLE) ×2 IMPLANT
DERMABOND ADVANCED (GAUZE/BANDAGES/DRESSINGS) ×1
DERMABOND ADVANCED .7 DNX12 (GAUZE/BANDAGES/DRESSINGS) ×1 IMPLANT
DRAIN CHANNEL 19F RND (DRAIN) ×2 IMPLANT
ELECT REM PT RETURN 9FT ADLT (ELECTROSURGICAL) ×2
ELECTRODE REM PT RTRN 9FT ADLT (ELECTROSURGICAL) ×1 IMPLANT
EVACUATOR SILICONE 100CC (DRAIN) ×2 IMPLANT
GAUZE SPONGE 4X4 12PLY STRL (GAUZE/BANDAGES/DRESSINGS) ×2 IMPLANT
GLOVE BIO SURGEON STRL SZ7.5 (GLOVE) ×2 IMPLANT
GLOVE INDICATOR 8.0 STRL GRN (GLOVE) ×2 IMPLANT
GOWN STRL REUS W/ TWL LRG LVL3 (GOWN DISPOSABLE) ×2 IMPLANT
GOWN STRL REUS W/ TWL XL LVL3 (GOWN DISPOSABLE) ×1 IMPLANT
GOWN STRL REUS W/TWL LRG LVL3 (GOWN DISPOSABLE) ×2
GOWN STRL REUS W/TWL XL LVL3 (GOWN DISPOSABLE) ×1
KIT BASIN OR (CUSTOM PROCEDURE TRAY) ×2 IMPLANT
KIT TURNOVER KIT B (KITS) ×2 IMPLANT
NS IRRIG 1000ML POUR BTL (IV SOLUTION) ×2 IMPLANT
PAD ARMBOARD 7.5X6 YLW CONV (MISCELLANEOUS) ×4 IMPLANT
PENCIL SMOKE EVACUATOR (MISCELLANEOUS) ×2 IMPLANT
POUCH SPECIMEN RETRIEVAL 10MM (ENDOMECHANICALS) ×4 IMPLANT
RELOAD 45 VASCULAR/THIN (ENDOMECHANICALS) IMPLANT
RELOAD STAPLE TA45 3.5 REG BLU (ENDOMECHANICALS) ×8 IMPLANT
SCISSORS LAP 5X35 DISP (ENDOMECHANICALS) IMPLANT
SET IRRIG TUBING LAPAROSCOPIC (IRRIGATION / IRRIGATOR) ×2 IMPLANT
SET TUBE SMOKE EVAC HIGH FLOW (TUBING) ×2 IMPLANT
SHEARS HARMONIC ACE PLUS 36CM (ENDOMECHANICALS) ×2 IMPLANT
SLEEVE ENDOPATH XCEL 5M (ENDOMECHANICALS) ×2 IMPLANT
SPECIMEN JAR SMALL (MISCELLANEOUS) ×2 IMPLANT
SUT MNCRL AB 4-0 PS2 18 (SUTURE) ×2 IMPLANT
TOWEL GREEN STERILE (TOWEL DISPOSABLE) ×2 IMPLANT
TOWEL GREEN STERILE FF (TOWEL DISPOSABLE) ×2 IMPLANT
TRAY FOLEY W/BAG SLVR 16FR (SET/KITS/TRAYS/PACK) ×1
TRAY FOLEY W/BAG SLVR 16FR ST (SET/KITS/TRAYS/PACK) ×1 IMPLANT
TRAY LAPAROSCOPIC MC (CUSTOM PROCEDURE TRAY) ×2 IMPLANT
TROCAR XCEL BLUNT TIP 100MML (ENDOMECHANICALS) ×2 IMPLANT
TROCAR XCEL NON-BLD 5MMX100MML (ENDOMECHANICALS) ×2 IMPLANT
WATER STERILE IRR 1000ML POUR (IV SOLUTION) ×2 IMPLANT

## 2020-02-18 NOTE — Anesthesia Preprocedure Evaluation (Signed)
Anesthesia Evaluation  Patient identified by MRN, date of birth, ID band Patient awake    Reviewed: Allergy & Precautions, NPO status , Patient's Chart, lab work & pertinent test results  Airway Mallampati: II  TM Distance: >3 FB Neck ROM: Full    Dental no notable dental hx.    Pulmonary COPD, former smoker,    Pulmonary exam normal breath sounds clear to auscultation       Cardiovascular hypertension, Normal cardiovascular exam Rhythm:Regular Rate:Normal     Neuro/Psych glaucoma negative neurological ROS  negative psych ROS   GI/Hepatic negative GI ROS, Neg liver ROS,   Endo/Other  negative endocrine ROS  Renal/GU negative Renal ROS  negative genitourinary   Musculoskeletal negative musculoskeletal ROS (+)   Abdominal   Peds negative pediatric ROS (+)  Hematology negative hematology ROS (+)   Anesthesia Other Findings   Reproductive/Obstetrics negative OB ROS                             Anesthesia Physical Anesthesia Plan  ASA: II  Anesthesia Plan: General   Post-op Pain Management:    Induction: Intravenous and Rapid sequence  PONV Risk Score and Plan: 2 and Ondansetron, Dexamethasone and Treatment may vary due to age or medical condition  Airway Management Planned: Oral ETT  Additional Equipment:   Intra-op Plan:   Post-operative Plan: Extubation in OR  Informed Consent: I have reviewed the patients History and Physical, chart, labs and discussed the procedure including the risks, benefits and alternatives for the proposed anesthesia with the patient or authorized representative who has indicated his/her understanding and acceptance.     Dental advisory given  Plan Discussed with: CRNA and Surgeon  Anesthesia Plan Comments:         Anesthesia Quick Evaluation

## 2020-02-18 NOTE — Progress Notes (Signed)
I have attempted to update his wife as he had requested, Hazel, but have been unsuccessful at reaching her. Have called all numbers listed for her 3 times. We will attempt to update again in the morning  Nadeen Landau, M.D. [facility] Use [email or web address] to contact on call provider

## 2020-02-18 NOTE — Anesthesia Postprocedure Evaluation (Signed)
Anesthesia Post Note  Patient: Steven Ellison  Procedure(s) Performed: APPENDECTOMY LAPAROSCOPIC, Partial Cecectomy (N/A )     Patient location during evaluation: PACU Anesthesia Type: General Level of consciousness: awake and alert Pain management: pain level controlled Vital Signs Assessment: post-procedure vital signs reviewed and stable Respiratory status: spontaneous breathing, nonlabored ventilation, respiratory function stable and patient connected to nasal cannula oxygen Cardiovascular status: blood pressure returned to baseline and stable Postop Assessment: no apparent nausea or vomiting Anesthetic complications: no    Last Vitals:  Vitals:   02/18/20 2145 02/18/20 2200  BP: 130/82 130/83  Pulse: 86 83  Resp: 16 12  Temp:  36.8 C  SpO2: 96% 95%    Last Pain:  Vitals:   02/18/20 2200  TempSrc:   PainSc: 0-No pain                 Min Collymore S

## 2020-02-18 NOTE — Progress Notes (Signed)
Patient admitted from PACU s/p laparoscopic appendectomy. Alert and oriented x4. VS stable. Denies c/o pain. Abdominal incisions clean,dry and intact.Foley intact and JP drain to LLQ in place. Oriented to room and remote.

## 2020-02-18 NOTE — ED Notes (Signed)
Pt transported to CT ?

## 2020-02-18 NOTE — Plan of Care (Signed)
  Problem: Education: Goal: Knowledge of General Education information will improve Description Including pain rating scale, medication(s)/side effects and non-pharmacologic comfort measures Outcome: Progressing   

## 2020-02-18 NOTE — ED Provider Notes (Signed)
[facility] EMERGENCY DEPARTMENT Provider Note   CSN: [id number] Arrival date & time: 02/18/20  1329     History Chief Complaint  Patient presents with  . Abdominal Pain    Steven Ellison is a 69 y.o. male.  The history is provided by the patient.  Abdominal Pain Pain location:  Suprapubic Pain quality: aching, sharp, shooting and stabbing   Pain radiates to:  Groin Pain severity:  Severe Onset quality:  Sudden Duration:  12 hours Timing:  Intermittent Progression:  Worsening Chronicity:  New Context comment:  Started last night and hurt all night long Relieved by: some improvement with advil this morning. Worsened by:  Nothing Ineffective treatments:  None tried Associated symptoms: flatus   Associated symptoms: no chest pain, no constipation, no cough, no diarrhea, no dysuria, no fever, no hematuria, no nausea, no shortness of breath and no vomiting   Associated symptoms comment:  Patient had a normal bowel movement this morning.  He also urinated and denies any sensation of urinary retention.  However the pain that was in his abdomen radiated severely into his right testicle but has improved now. Risk factors comment:  Hx of COPD, HTN, 1 kidney stone in the past      Past Medical History:  Diagnosis Date  . Back pain    spurs  . COPD (chronic obstructive pulmonary disease) ([place])    "mild per pt"  . Depression    takes Paxil daily  . Enlarged prostate   . Glaucoma    uses eye drops daily  . History of blood transfusion    as a child  . Hyperlipidemia    takes Fenofibrate daily  . Hypertension    takes Lisinopril daily  . Kidney stone   . Nocturia    takes [employer] daily  . Tuberculosis    as a child  . Weakness    numbness and tingling in left leg    There are no problems to display for this patient.   Past Surgical History:  Procedure Laterality Date  . cataract and glaucoma  Bilateral   . COLONOSCOPY    . LUMBAR  LAMINECTOMY/DECOMPRESSION MICRODISCECTOMY N/A 06/22/2015   Procedure: LUMBAR LAMINECTOMY/DECOMPRESSION MICRODISCECTOMY;  Surgeon: Phylliss Bob, MD;  Location: [facility];  Service: Orthopedics;  Laterality: N/A;  Lumbar 3-4 decompression  . RETINAL DETACHMENT SURGERY Left        History reviewed. No pertinent family history.  Social History   Tobacco Use  . Smoking status: Former Research scientist (life sciences)  . Smokeless tobacco: Never Used  . Tobacco comment: quit smoking in 2005  Substance Use Topics  . Alcohol use: Yes    Comment: wine rarely  . Drug use: No    Home Medications Prior to Admission medications   Medication Sig Start Date End Date Taking? Authorizing Provider  acetaminophen (TYLENOL) 500 MG tablet Take 500 mg by mouth 3 (three) times daily.    [provider]  dorzolamide-timolol (COSOPT) 22.3-6.8 MG/ML ophthalmic solution Place 1 drop into the right eye daily. 05/06/15   [provider]  fenofibrate 160 MG tablet Take 160 mg by mouth daily. 03/13/15   [provider]  lisinopril (PRINIVIL,ZESTRIL) 20 MG tablet Take 20 mg by mouth daily. 03/13/15   [provider]  PARoxetine (PAXIL-CR) 25 MG 24 hr tablet Take 25 mg by mouth every morning. 04/07/15   [provider]  Saw Palmetto, Serenoa repens, (SAW PALMETTO PO) Take 1 tablet by mouth daily.  [provider]  TRAVATAN Z 0.004 % SOLN ophthalmic solution Place 1 drop into the right eye every evening. 05/06/15   [provider]    Allergies    Patient has no allergy information on record.  Review of Systems   Review of Systems  Constitutional: Negative for fever.  Respiratory: Negative for cough and shortness of breath.   Cardiovascular: Negative for chest pain.  Gastrointestinal: Positive for abdominal pain and flatus. Negative for constipation, diarrhea, nausea and vomiting.  Genitourinary: Negative for dysuria and hematuria.  All other systems reviewed and are  negative.   Physical Exam Updated Vital Signs BP 133/60 (BP Location: Right Arm)   Pulse 67   Temp 99.4 F (37.4 C) (Oral)   Resp (!) 28   Ht 5\' 10"  (1.778 m)   Wt 106.6 kg   SpO2 98%   BMI 33.72 kg/m   Physical Exam Vitals and nursing note reviewed.  Constitutional:      General: He is in acute distress.     Appearance: He is well-developed and normal weight.  HENT:     Head: Normocephalic and atraumatic.  Eyes:     Conjunctiva/sclera: Conjunctivae normal.     Pupils: Pupils are equal, round, and reactive to light.  Cardiovascular:     Rate and Rhythm: Normal rate and regular rhythm.     Heart sounds: No murmur.  Pulmonary:     Effort: Pulmonary effort is normal. No respiratory distress.     Breath sounds: Normal breath sounds. No wheezing or rales.  Abdominal:     General: Abdomen is flat. There is no distension.     Palpations: Abdomen is soft.     Tenderness: There is abdominal tenderness in the right lower quadrant, suprapubic area and left lower quadrant. There is guarding. There is no right CVA tenderness, left CVA tenderness or rebound.  Genitourinary:    Testes: Normal.        Right: Mass or tenderness not present.        Left: Mass or tenderness not present.  Musculoskeletal:        General: No tenderness. Normal range of motion.     Cervical back: Normal range of motion and neck supple.  Skin:    General: Skin is warm and dry.     Findings: No erythema or rash.  Neurological:     General: No focal deficit present.     Mental Status: He is alert and oriented to person, place, and time.  Psychiatric:        Mood and Affect: Mood normal.        Behavior: Behavior normal.     ED Results / Procedures / Treatments   Labs (all labs ordered are listed, but only abnormal results are displayed) Labs Reviewed  CBC WITH DIFFERENTIAL/PLATELET - Abnormal; Notable for the following components:      Result Value   WBC 11.5 (*)    Neutro Abs 9.6 (*)    All  other components within normal limits  COMPREHENSIVE METABOLIC PANEL - Abnormal; Notable for the following components:   CO2 19 (*)    Glucose, Bld 129 (*)    Calcium 8.0 (*)    Total Protein 6.1 (*)    All other components within normal limits  RESPIRATORY PANEL BY RT PCR (FLU A&B, COVID)  LIPASE, BLOOD  URINALYSIS, ROUTINE W REFLEX MICROSCOPIC    EKG EKG Interpretation  Date/Time:  Thursday February 18 2020 13:32:44 EST Ventricular  Rate:  68 PR Interval:    QRS Duration: 113 QT Interval:  387 QTC Calculation: 412 R Axis:   -29 Text Interpretation: Sinus rhythm Borderline intraventricular conduction delay No significant change since last tracing Confirmed by Blanchie Dessert [phone]) on 02/18/2020 1:51:34 PM   Radiology No results found.  Procedures Procedures (including critical care time)  Medications Ordered in ED Medications  0.9 %  sodium chloride infusion ( Intravenous New Bag/Given 02/18/20 1356)  cefTRIAXone (ROCEPHIN) 2 g in sodium chloride 0.9 % 100 mL IVPB (has no administration in time range)    And  metroNIDAZOLE (FLAGYL) IVPB 500 mg (has no administration in time range)  HYDROmorphone (DILAUDID) injection 1 mg (1 mg Intravenous Given 02/18/20 1357)  ondansetron (ZOFRAN) injection 4 mg (4 mg Intravenous Given 02/18/20 1356)  iohexol (OMNIPAQUE) 300 MG/ML solution 100 mL (100 mLs Intravenous Contrast Given 02/18/20 1535)    ED Course  I have reviewed the triage vital signs and the nursing notes.  Pertinent labs & imaging results that were available during my care of the patient were reviewed by me and considered in my medical decision making (see chart for details).    MDM Rules/Calculators/A&P                     Elderly gentleman presenting today with abdominal pain that started last night.  Patient appears uncomfortable and rates his pain at a 7 out of 10.  It is in the suprapubic region and he reports it radiates to his groin.  Normal bowel movement and  normal urine this morning.  No symptoms consistent with urinary retention.  Patient is hemodynamically stable.  He did receive fentanyl in route with only minimal improvement.  Concern for kidney stone versus bowel perforation vs appy versus volvulus versus AAA.  Patient currently hemodynamically stable.  Labs and imaging pending.  Patient given pain control.  4:06 PM Patient's labs with a mild leukocytosis of 11.5.  CMP without acute findings.  Lipase within normal limits.  CT scan shows an acute appendicitis without perforation.  Patient was covered with Rocephin and Flagyl.  General surgery consulted for further care.   Final Clinical Impression(s) / ED Diagnoses Final diagnoses:  Acute appendicitis with localized peritonitis, without perforation, abscess, or gangrene    Rx / DC Orders ED Discharge Orders    None       Blanchie Dessert, MD 02/18/20 1607

## 2020-02-18 NOTE — H&P (Signed)
[facility] Admission Note  Steven Ellison November 04, 1951  [id number].    Requesting MD: Blanchie Dessert Chief Complaint: Lower abdominal pain radiating to the right testicle Reason for Consult: Acute appendicitis   HPI:  Patient is a 69 year old male who started developing lower abdominal pain yesterday became increasingly worse overnight.  Pain radiating down to his right testicle this morning.  He has had some nausea with exertion.  He was seen by EMS and treated with that 250 cc fluid bolus and 100 mg of fentanyl with no relief.  He does have a history of kidney stones.  Has some BPH.  Work-up in the ED shows a temperature of 99.4.  Vital signs are stable.  CMP shows a potassium of 3.5, glucose of 129, creatinine of 120, LFTs are normal lipase is 26, WBC 11.5, hemoglobin 15.4, hematocrit 47.8, platelets 224000.  CT of the abdomen pelvis with contrast: Shows patient has acute appendicitis.  The appendix is markedly distended to a diameter of 18 mm with extensive periappendiceal soft tissue stranding and some free fluid in the pelvis extending into the right paracolic gutter and across the midline.  No discrete abscess or free air.  The terminal ileum appears normal.  Is also some hepatic steatosis, diverticulosis in the left colon, aortic atherosclerosis and a pars defect at L5 with degenerative disc disease.  We are asked to see.  Covid study has been ordered and is pending.  ROS: Review of Systems  Constitutional: Positive for fever (Low-grade fever in the ED). Negative for chills, diaphoresis, malaise/fatigue and weight loss.  HENT: Negative.   Eyes: Negative.   Respiratory: Negative.   Cardiovascular: Negative.   Gastrointestinal: Positive for abdominal pain and nausea. Negative for blood in stool, diarrhea, melena and vomiting.  Genitourinary: Negative.   Musculoskeletal: Positive for back pain.  Skin: Negative.   Neurological: Negative.   Endo/Heme/Allergies: Negative.    Psychiatric/Behavioral: Negative.     History reviewed. No pertinent family history.  Past Medical History:  Diagnosis Date  . Back pain    spurs  . COPD (chronic obstructive pulmonary disease) ([place])    "mild per pt"  . Depression    takes Paxil daily  . Enlarged prostate   . Glaucoma    uses eye drops daily  . History of blood transfusion    as a child  . Hyperlipidemia    takes Fenofibrate daily  . Hypertension    takes Lisinopril daily  . Kidney stone   . Nocturia    takes [employer] daily  . Tuberculosis    as a child  . Weakness    numbness and tingling in left leg    Past Surgical History:  Procedure Laterality Date  . cataract and glaucoma  Bilateral   . COLONOSCOPY    . LUMBAR LAMINECTOMY/DECOMPRESSION MICRODISCECTOMY N/A 06/22/2015   Procedure: LUMBAR LAMINECTOMY/DECOMPRESSION MICRODISCECTOMY;  Surgeon: Phylliss Bob, MD;  Location: [facility];  Service: Orthopedics;  Laterality: N/A;  Lumbar 3-4 decompression  . RETINAL DETACHMENT SURGERY Left     Social History:  reports that he has quit smoking. He has never used smokeless tobacco. He reports current alcohol use. He reports that he does not use drugs. Retired lives with his wife who recently broke her hip.  He has been at home taking care of her. Tobacco: 33 years 2 to 3 packs/day -reports he has quit smoking.  Quit 2005 EtOH: Social Drugs: None [place] era veteran stationed in [place]   allergies: Not  on File  Prior to Admission medications   Medication Sig Start Date End Date Taking? Authorizing Provider  acetaminophen (TYLENOL) 500 MG tablet Take 500 mg by mouth 3 (three) times daily.    [provider]  dorzolamide-timolol (COSOPT) 22.3-6.8 MG/ML ophthalmic solution Place 1 drop into the right eye daily. 05/06/15   [provider]  fenofibrate 160 MG tablet Take 160 mg by mouth daily. 03/13/15   [provider]  lisinopril (PRINIVIL,ZESTRIL) 20 MG tablet Take 20 mg by mouth  daily. 03/13/15   [provider]  PARoxetine (PAXIL-CR) 25 MG 24 hr tablet Take 25 mg by mouth every morning. 04/07/15   [provider]  Saw Palmetto, Serenoa repens, (SAW PALMETTO PO) Take 1 tablet by mouth daily.    [provider]  TRAVATAN Z 0.004 % SOLN ophthalmic solution Place 1 drop into the right eye every evening. 05/06/15   [provider]     Blood pressure (!) 118/107, pulse 90, temperature 99.4 F (37.4 C), temperature source Oral, resp. rate (!) 26, height 5\' 10"  (1.778 m), weight 106.6 kg, SpO2 93 %. Physical Exam:  General: pleasant, WD, white male in having a good deal of pain right lower quadrant  HEENT: head is normocephalic, atraumatic.  Sclera are noninjected.  Pupils are equal .  He has a nasal cannula in place and feels somewhat short of breath secondary to abdominal discomfort  mouth is pink and moist -has had some coffee and some coffee cake today's nothing for some hours. Heart: regular, rate, and rhythm.  No rubs noted.  Palpable radial and pedal pulses bilaterally Lungs: He has some rales in both bases.  He seems short of breath but O2 sats are good on nasal cannula. Abd: soft, he is extremely tender to palpation.  Pain is worse in the right lower quadrant.  Some rebound. MS: all 4 extremities are symmetrical with no cyanosis, clubbing, or edema. Skin: warm and dry with no masses, lesions, or rashes Neuro: Cranial nerves 2-12 grossly intact, speech is normal Psych: A&Ox3 with an appropriate affect.   Results for orders placed or performed during the hospital encounter of 02/18/20 (from the past 48 hour(s))  CBC with Differential/Platelet     Status: Abnormal   Collection Time: 02/18/20  2:08 PM  Result Value Ref Range   WBC 11.5 (H) 4.0 - 10.5 K/uL   RBC 4.84 4.22 - 5.81 MIL/uL   Hemoglobin 15.4 13.0 - 17.0 g/dL   HCT 47.8 39.0 - 52.0 %   MCV 98.8 80.0 - 100.0 fL   MCH 31.8 26.0 - 34.0 pg   MCHC 32.2 30.0 - 36.0 g/dL   RDW  13.0 11.5 - 15.5 %   Platelets 224 150 - 400 K/uL   nRBC 0.0 0.0 - 0.2 %   Neutrophils Relative % 84 %   Neutro Abs 9.6 (H) 1.7 - 7.7 K/uL   Lymphocytes Relative 11 %   Lymphs Abs 1.2 0.7 - 4.0 K/uL   Monocytes Relative 5 %   Monocytes Absolute 0.6 0.1 - 1.0 K/uL   Eosinophils Relative 0 %   Eosinophils Absolute 0.0 0.0 - 0.5 K/uL   Basophils Relative 0 %   Basophils Absolute 0.1 0.0 - 0.1 K/uL   Immature Granulocytes 0 %   Abs Immature Granulocytes 0.03 0.00 - 0.07 K/uL    Comment: Performed at [facility], [address], [place], [place] [address]  Comprehensive metabolic panel  Status: Abnormal   Collection Time: 02/18/20  2:08 PM  Result Value Ref Range   Sodium 138 135 - 145 mmol/L   Potassium 3.5 3.5 - 5.1 mmol/L   Chloride 108 98 - 111 mmol/L   CO2 19 (L) 22 - 32 mmol/L   Glucose, Bld 129 (H) 70 - 99 mg/dL   BUN 16 8 - 23 mg/dL   Creatinine, Ser 1.20 0.61 - 1.24 mg/dL   Calcium 8.0 (L) 8.9 - 10.3 mg/dL   Total Protein 6.1 (L) 6.5 - 8.1 g/dL   Albumin 3.7 3.5 - 5.0 g/dL   AST 19 15 - 41 U/L   ALT 17 0 - 44 U/L   Alkaline Phosphatase 54 38 - 126 U/L   Total Bilirubin 1.2 0.3 - 1.2 mg/dL   GFR calc non Af Amer >60 >60 mL/min   GFR calc Af Amer >60 >60 mL/min   Anion gap 11 5 - 15    Comment: Performed at [facility], [address] [place] [address] [place], [place] [address]  Lipase, blood     Status: None   Collection Time: 02/18/20  2:08 PM  Result Value Ref Range   Lipase 26 11 - 51 U/L    Comment: Performed at [facility] [address] [place], [place] [address]   CT ABDOMEN PELVIS W CONTRAST  Result Date: 02/18/2020 CLINICAL DATA:  Acute nonlocalized abdominal pain. The pain radiates into the right testicle. History of kidney stones. EXAM: CT ABDOMEN AND PELVIS WITH CONTRAST TECHNIQUE: Multidetector CT imaging of the abdomen and pelvis was performed using the standard protocol following bolus administration of intravenous contrast. CONTRAST:  120mL  OMNIPAQUE IOHEXOL 300 MG/ML  SOLN COMPARISON:  None. FINDINGS: Lower chest: No acute abnormality. Hepatobiliary: Hepatic steatosis. 14 mm cyst in the inferior aspect of the left lobe. Biliary tree is normal. Pancreas: Unremarkable. No pancreatic ductal dilatation or surrounding inflammatory changes. Spleen: Normal in size without focal abnormality. Adrenals/Urinary Tract: Normal adrenal glands. 8 mm cyst in the lower pole of the left kidney. Eleven and 8 and 3 mm cyst in the lower pole of the right kidney. No hydronephrosis. Bladder is normal. Stomach/Bowel: Patient has acute appendicitis. The appendix is markedly distended to a diameter of 18 mm with extensive periappendiceal soft tissue stranding with some free fluid in the pelvis and extending in the right pericolic gutter and across the midline. No discrete abscess or free air. Terminal ileum appears normal. Stomach is normal. Multiple diverticula in the left side of the colon. Vascular/Lymphatic: Aortic atherosclerosis. No enlarged abdominal or pelvic lymph nodes. Reproductive: Prostate is unremarkable. Other: No abdominal wall hernia. Musculoskeletal: No acute abnormality. Bilateral pars defects at L5 with degenerative disc disease at L5-S1. Minimal spondylolisthesis at L5-S1. IMPRESSION: 1. Acute appendicitis with extensive periappendiceal soft tissue stranding and free fluid in the pelvis and right pericolic gutter. 2. Hepatic steatosis. 3. Diverticulosis of the left side of the colon. 4. Aortic atherosclerosis. 5. Bilateral pars defects at L5 with degenerative disc disease at L5-S1 and minimal spondylolisthesis at L5-S1. 6. Critical Value/emergent results were called by telephone at the time of interpretation on 02/18/2020 at ~4: 00 pm to provider Blanchie Dessert, MD , who verbally acknowledged these results. Aortic Atherosclerosis (ICD10-I70.0). Electronically Signed   By: Lorriane Shire M.D.   On: 02/18/2020 16:08      Assessment/Plan Acute  appendicitis Hypertension COPD/Hx tobacco use 33 years 2-3 PPD BPH Chronic back pain Hx of retinal detachment  Plan:  Patient is having a good deal of pain or discomfort in the emergency department.  He is somewhat short of breath, has some rales.  Chest x-ray is pending, Covid study is pending.  We have him tentatively posted for surgery later this evening after the Covid study is completed.  We will admit, place on IV antibiotics, IV hydration, potassium replacement, and give additional pain medication.    Earnstine Regal [facility] 02/18/2020, 4:17 PM Please see Amion for pager number during day hours 7:00am-4:30pm

## 2020-02-18 NOTE — Progress Notes (Signed)
Spoke with patient's wife Onalee Hua on the phone.  Wife updated on patient's condition and room assignment.  6N phone number given to wife and current visitation guidelines discussed.  Wife appreciative of update.

## 2020-02-18 NOTE — Transfer of Care (Signed)
Immediate Anesthesia Transfer of Care Note  Patient: Steven Ellison  Procedure(s) Performed: APPENDECTOMY LAPAROSCOPIC, Partial Cecectomy (N/A )  Patient Location: PACU  Anesthesia Type:General  Level of Consciousness: awake, alert  and oriented  Airway & Oxygen Therapy: Patient Spontanous Breathing and Patient connected to nasal cannula oxygen  Post-op Assessment: Report given to RN and Post -op Vital signs reviewed and stable  Post vital signs: Reviewed and stable  Last Vitals:  Vitals Value Taken Time  BP 133/79 02/18/20 2123  Temp    Pulse 83 02/18/20 2124  Resp 14 02/18/20 2124  SpO2 95 % 02/18/20 2124  Vitals shown include unvalidated device data.  Last Pain:  Vitals:   02/18/20 1523  TempSrc:   PainSc: 4          Complications: No apparent anesthesia complications

## 2020-02-18 NOTE — ED Triage Notes (Signed)
Pt here from home for lower abd pain that began yesterday and has increasingly gotten worse today w/ radiating to R testicle. Per pt he has only had nausea w/ exertion. EMS gave 250 ml NS and 134mcg of fentanyl w/ no relief. Hx of kidney stones and "problem w/ prostate". AOx4, VSS.

## 2020-02-18 NOTE — ED Notes (Signed)
Pt O2 sat 87% on RA, RN placed pt on 2L [place], O2 sat now 92%

## 2020-02-18 NOTE — Anesthesia Procedure Notes (Signed)
Procedure Name: Intubation Date/Time: 02/18/2020 7:10 PM Performed by: Jearld Pies, CRNA Pre-anesthesia Checklist: Patient identified, Emergency Drugs available, Suction available and Patient being monitored Patient Re-evaluated:Patient Re-evaluated prior to induction Oxygen Delivery Method: Circle System Utilized Preoxygenation: Pre-oxygenation with 100% oxygen Induction Type: IV induction, Rapid sequence and Cricoid Pressure applied Laryngoscope Size: Mac and 4 Grade View: Grade III Tube type: Oral Tube size: 7.5 mm Number of attempts: 1 Airway Equipment and Method: Stylet Placement Confirmation: ETT inserted through vocal cords under direct vision,  positive ETCO2 and breath sounds checked- equal and bilateral Secured at: 23 cm Tube secured with: Tape Dental Injury: Teeth and Oropharynx as per pre-operative assessment

## 2020-02-18 NOTE — Op Note (Addendum)
Steven Ellison [id number]   PRE-OPERATIVE DIAGNOSIS:  Acute appendicitis  POST-OPERATIVE DIAGNOSIS:  Acute perforated appendicitis with purulent peritonitis  PROCEDURE: Laparoscopic partial cecectomy (with appendectomy)  SURGEON:  Sharon Mt. Dema Severin, M.D.  ANESTHESIA: General endotracheal  EBL:   20 mL  DRAINS: 19 Fr round blake drain left draining right lower quadrant  SPECIMEN:   1. Appendix 2. Appendiceal base/cecal wall with free fecaliths  COUNTS:  Sponge, needle and instrument counts were reported correct x2 at conclusion of the operation  DISPOSITION:  PACU in satisfactory condition  COMPLICATIONS: None  FINDINGS: Acute perforated appendicitis with purulent peritonitis. Appendix perforated at its base. There were 2 fecaliths found adjacent to the appendix. Appendix was divided. Following this, a partial cecectomy was carried out to fully address the perforation at the base. In doing this, the ileocecal valve was able to be preserved without any apparent narrowing. Pus (purulent fluid) was found above the liver and in the pelvis - copiously irrigated to clear this all out.  INDICATIONS: Steven Ellison  is a very pleasant 26yoM with hx of HTN, HLD, glaucoma, kidney stones, mild COPD whom presented to the emergency room with 1 day of right lower quadrant abdominal pain.  This has steadily progressed over the last 24 hours.  He underwent evaluation and was found to be tender in the right lower quadrant with focal guarding.  WBC was 11.5.  He underwent CT scan of the abdomen/pelvis which showed distended appendix to 18 mm with extensive periappendiceal stranding and some free fluid in pelvis/along ascending colon. No discrete abscess or free air. TI appeared normal. Findings all consistent with acute appendicitis without evident perforation or abscess. We discussed everything this evening and options moving forward. He opted to pursue surgery. Please refer to notes elsewhere for details  regarding this discussion.   DESCRIPTION:  Of note, in preop, he was unable to void despite a full bladder and does have history of BPH.  The patient was identified & brought into the operating room. SCDs were in place and functioning. General endotracheal anesthesia was administered. Preoperative antibiotics were administered. The patient was positioned supine with left arm tucked. Hair on the abdomen was then clipped by the OR team. A foley catheter was inserted under sterile conditions. The abdomen was prepped and draped in the standard sterile fashion. A surgical timeout confirmed our plan.  A small incision was made in the infraumbilical skin. The subcutaneous tissue was dissected and the umbilical stalk identified. The stalk was grasped with a Kocher and retracted outwardly. The infraumbilical fascia was exposed and incised. Peritoneal entry was carefully made bluntly. A 0 Vicryl purse-string suture was placed and then the [facility] port was introduced into the abdomen.  CO2 insufflation commenced to 67mmHg. The laparoscope was inserted and confirmed no evidence of trocar site complications. The patient was then positioned in Trendelenburg. Two additional ports were placed - one in left lower quadrant and another in the suprapubic midline taking care to stay well above the bladder - 3 fingerbreadths above the pubic symphysis. The bed was then slightly tilted to place the left side down.  There was purulent pus fluid throughout the abdomen including above the liver with injected, inflamed peritoneum consistent with purulent peritonitis.  Pus was evacuated from the right lower quadrant.  There were matted loops of bowel overlying the cecum which were able to be carefully freed bluntly without significant difficulty.  These loops were then inspected and noted to be free of any injury.  The appendix was identified and perforated at its base.  There were 2 fecaliths just adjacent to this perforation. The  terminal ileum was identified. This was gently mobilized sharply up to the cecum in a lateral to medial fashion. Care was taken to avoid injuring any retroperitoneal structures. The appendix was identified and attachments to the appendix to the surrounding tissues were freed without difficulty.  The appendix was elevated.  At this point, it was clear that at the very least, a partial septectomy would ultimately be necessary to address this perforation.  Therefore, the cecum and proximal half of the ascending colon were also mobilized in a lateral to medial fashion by taking down the Steven Ellison line of Toldt.  The base of the appendix was circumferentially dissected taking care to preserve the cecum free of injury. The terminal ileum, cecum and ascending colon also appeared normal.  To facilitate exposure of the perforation, the appendix was divided just above the perforation using laparoscopic fluids stapler.  The mesoappendix was then divided using a harmonic scalpel.  This was then placed into an Endo Catch bag so as not to lose it and passed off the specimen.  The perforation at the base was then reinspected.  This was relatively far away from the ileocecal valve.  The terminal ileum was traced to its location right into the cecum.  Noted was clear that a partial cecectomy would be possible without significantly narrowing to the ileocecal valve.  Using 3 sequential firings of the laparoscopic blue load 45 mm stapler, a partial cecectomy was carried out. The cecum at this location was healthy and normal in appearance.  A small amount of associated mesentery was divided using a harmonic scalpel.  This and the 2 fecaliths were then placed in an EndoBag, removed from the abdomen and passed off as specimen.  The right lower quadrant was irrigated.  The cecal staple line was intact and hemostatic.  The cecum was healthy in appearance.  The ileocecal valve was reinspected and noted to be away from our point of  transection and appears patent.  The appendiceal mesentery was hemostatic.  Pus containing fluid was present in the pelvis and evacuated.  The right upper quadrant also contain pus which was evacuated with the laparoscopic suction irrigator.  Following this, the right upper quadrant, right gutter, and pelvis were all irrigated copiously with saline until the effluent ran clear.  Given the findings, a 67 French round was placed through the left lower quadrant trocar site and left draining the right lower quadrant.  Hemostasis was noted.   The suprapubic port was removed under direct visualization. The CO2 was exhausted from the abdomen. The umbilical fascia was then closed by closing the 0 Vicryl suture. The fascia was palpated and noted to be completely closed. The umbilical port site subcutaneous tissue was irrigated with saline.  The skin of all port sites was then approximated using 4-0 Monocryl suture. The incisions were covered with Dermabond.  The bulb was attached to suction.  He was then awakened from general anesthesia, extubated, and transferred to a stretcher for transport to recover in satisfactory condition.   Given urinary retention preop, plan for Foley postoperatively and will reassess for removal tomorrow.

## 2020-02-19 LAB — BASIC METABOLIC PANEL
Anion gap: 12 (ref 5–15)
BUN: 16 mg/dL (ref 8–23)
CO2: 20 mmol/L — ABNORMAL LOW (ref 22–32)
Calcium: 8.5 mg/dL — ABNORMAL LOW (ref 8.9–10.3)
Chloride: 105 mmol/L (ref 98–111)
Creatinine, Ser: 1.23 mg/dL (ref 0.61–1.24)
GFR calc Af Amer: >60 mL/min (ref >60)
GFR calc non Af Amer: 59 mL/min — ABNORMAL LOW (ref >60)
Glucose, Bld: 189 mg/dL — ABNORMAL HIGH (ref 70–99)
Potassium: 3.9 mmol/L (ref 3.5–5.1)
Sodium: 137 mmol/L (ref 135–145)

## 2020-02-19 LAB — CBC
HCT: 44.2 % (ref 39.0–52.0)
Hemoglobin: 14.7 g/dL (ref 13.0–17.0)
MCH: 32.2 pg (ref 26.0–34.0)
MCHC: 33.3 g/dL (ref 30.0–36.0)
MCV: 96.7 fL (ref 80.0–100.0)
Platelets: 162 10*3/uL (ref 150–400)
RBC: 4.57 MIL/uL (ref 4.22–5.81)
RDW: 13.3 % (ref 11.5–15.5)
WBC: 9.8 10*3/uL (ref 4.0–10.5)
nRBC: 0 % (ref 0.0–0.2)

## 2020-02-19 MED ORDER — ACETAMINOPHEN 10 MG/ML IV SOLN
1000.0000 mg | Freq: Four times a day (QID) | INTRAVENOUS | Status: AC
  Administered 2020-02-19 – 2020-02-20 (×4): 1000 mg via INTRAVENOUS
  Filled 2020-02-19 (×4): qty 100

## 2020-02-19 MED ORDER — FENOFIBRATE 160 MG PO TABS
160.0000 mg | ORAL_TABLET | Freq: Every day | ORAL | Status: DC
  Administered 2020-02-22: 11:00:00 160 mg via ORAL
  Filled 2020-02-19: qty 1

## 2020-02-19 MED ORDER — METHOCARBAMOL 1000 MG/10ML IJ SOLN
500.0000 mg | Freq: Three times a day (TID) | INTRAVENOUS | Status: DC
  Administered 2020-02-19 – 2020-03-04 (×42): 500 mg via INTRAVENOUS
  Filled 2020-02-19 (×3): qty 5
  Filled 2020-02-19: qty 500
  Filled 2020-02-19 (×3): qty 5
  Filled 2020-02-19: qty 500
  Filled 2020-02-19 (×4): qty 5
  Filled 2020-02-19 (×2): qty 500
  Filled 2020-02-19 (×8): qty 5
  Filled 2020-02-19: qty 500
  Filled 2020-02-19 (×8): qty 5
  Filled 2020-02-19: qty 500
  Filled 2020-02-19 (×4): qty 5
  Filled 2020-02-19: qty 500
  Filled 2020-02-19 (×9): qty 5

## 2020-02-19 MED ORDER — LISINOPRIL 20 MG PO TABS
20.0000 mg | ORAL_TABLET | Freq: Every day | ORAL | Status: DC
  Administered 2020-02-22 – 2020-02-27 (×6): 20 mg via ORAL
  Filled 2020-02-19 (×6): qty 1

## 2020-02-19 MED ORDER — CHLORHEXIDINE GLUCONATE CLOTH 2 % EX PADS
6.0000 | MEDICATED_PAD | Freq: Every day | CUTANEOUS | Status: DC
  Administered 2020-02-19 – 2020-03-05 (×15): 6 via TOPICAL

## 2020-02-19 MED ORDER — HYDROMORPHONE HCL 1 MG/ML IJ SOLN
1.0000 mg | INTRAMUSCULAR | Status: DC | PRN
  Administered 2020-02-19 – 2020-02-25 (×18): 1 mg via INTRAVENOUS
  Filled 2020-02-19 (×19): qty 1

## 2020-02-19 NOTE — Progress Notes (Signed)
1 Day Post-Op    CC: Abdominal pain  Subjective: He is pretty miserable this AM.  His abdomen is distended there are no bowel sounds.  He strained very hard not to move.  Taken almost nothing for pain.  Foley is in place.  He is moving no more than 250-500 on his incentive spirometer.  Objective: Vital signs in last 24 hours: Temp:  [97.3 F (36.3 C)-99.4 F (37.4 C)] 98 F (36.7 C) (02/19 0605) Pulse Rate:  [67-90] 70 (02/19 0605) Resp:  [12-29] 20 (02/19 0605) BP: (105-152)/(50-107) 132/73 (02/19 0605) SpO2:  [92 %-98 %] 95 % (02/19 0605) Weight:  [106.6 kg] 106.6 kg (02/18 1836) Last BM Date: 02/18/20 2205 IV 975 urine 165 through the drain Afebrile vital signs are stable Glucose 189 remainder the BMP is normal WBC 9.8 H/H 14.7/44.2 Platelets 162,000 Intake/Output from previous day: 02/18 0701 - 02/19 0700 In: 2205 [I.V.:2155; IV Piggyback:50] Out: 1200 [Urine:975; Drains:165; Blood:10] Intake/Output this shift: No intake/output data recorded.  General appearance: alert, cooperative and no distress Resp: Clear anterior, not moving much air secondary to pain. Cardio: Regular rate and rhythm. GI: His abdomen is distended, he is tender and tight.  No bowel sounds, no flatus.  Port sites looks fine.  Midline incision looks fine.  The IR drain has cloudy mostly serous fluid in it. Male genitalia: Foley is in place. Extremities: extremities normal, atraumatic, no cyanosis or edema  Lab Results:  Recent Labs    02/18/20 1408 02/19/20 0311  WBC 11.5* 9.8  HGB 15.4 14.7  HCT 47.8 44.2  PLT 224 162    BMET Recent Labs    02/18/20 1408 02/19/20 0311  NA 138 137  K 3.5 3.9  CL 108 105  CO2 19* 20*  GLUCOSE 129* 189*  BUN 16 16  CREATININE 1.20 1.23  CALCIUM 8.0* 8.5*   PT/INR No results for input(s): LABPROT, INR in the last 72 hours.  Recent Labs  Lab 02/18/20 1408  AST 19  ALT 17  ALKPHOS 54  BILITOT 1.2  PROT 6.1*  ALBUMIN 3.7     Lipase      Component Value Date/Time   LIPASE 26 02/18/2020 1408     Medications: . acetaminophen  1,000 mg Oral Q6H  . Chlorhexidine Gluconate Cloth  6 each Topical Daily  . cholecalciferol  3,000 Units Oral Daily  . docusate sodium  200 mg Oral BID  . dorzolamide-timolol  1 drop Right Eye BID  . fenofibrate  160 mg Oral Daily  . heparin injection (subcutaneous)  5,000 Units Subcutaneous Q8H  . latanoprost  1 drop Right Eye QHS  . lisinopril  20 mg Oral Daily  . Netarsudil-Latanoprost  1 drop Right Eye Daily  . PARoxetine  25 mg Oral q morning - 10a   . lactated ringers 125 mL/hr at 02/19/20 0400  . piperacillin-tazobactam (ZOSYN)  IV 3.375 g (02/19/20 [id number])    Assessment/Plan Hypertension COPD/Hx tobacco use x33 years BPH Chronic back pain Hx retinal detachment  Acute perforated appendicitis with purulent peritonitis Laparoscopic partial cecetomy with appendectomy, 19 French Blake drain placement 02/18/2020 Dr. Nadeen Landau  FEN: IV fluids/clears -sips and chips right now ID: Rocephin/Flagyl preop 2/18; Zosyn 2/18>> day 2 DVT: Heparin Follow-up: Dr. Nadeen Landau Foley: Pain: Tylenol 1 g q6h; no other pain med since MN  Plan: A work on getting him some pain relief.  We will work to get him out of bed later.  We will leave his  Foley in for right now.  Continue IV antibiotics.  Leave him on sips and chips for now.     LOS: 1 day    Ronia Hazelett 02/19/2020 Please see Amion

## 2020-02-20 ENCOUNTER — Inpatient Hospital Stay (HOSPITAL_COMMUNITY): Payer: Medicare Other

## 2020-02-20 MED ORDER — PHENYLEPHRINE HCL-NACL 10-0.9 MG/250ML-% IV SOLN
INTRAVENOUS | Status: AC
  Filled 2020-02-20: qty 250

## 2020-02-20 NOTE — Progress Notes (Signed)
Patient ID: Steven Ellison, male   DOB: 06-Dec-1951, 69 y.o.   MRN: [id number] [facility] Surgery Progress Note:   2 Days Post-Op  Subjective: Mental status is clear but very uncomfortable. Objective: Vital signs in last 24 hours: Temp:  [97.4 F (36.3 C)-98 F (36.7 C)] 97.4 F (36.3 C) (02/20 0445) Pulse Rate:  [66-75] 75 (02/20 0445) Resp:  [16-19] 16 (02/20 0445) BP: (129-147)/(68-78) 147/78 (02/20 0445) SpO2:  [95 %] 95 % (02/20 0445)  Intake/Output from previous day: 02/19 0701 - 02/20 0700 In: 3782.6 [P.O.:840; I.V.:2381.6; IV Piggyback:561] Out: [id number] [Urine:800; Drains:115] Intake/Output this shift: Total I/O In: 0  Out: 330 [Urine:300; Drains:30]  Physical Exam: Work of breathing is limited by abdominal distention;  LLQ drain has purulence in it.    Lab Results:  Results for orders placed or performed during the hospital encounter of 02/18/20 (from the past 48 hour(s))  CBC with Differential/Platelet     Status: Abnormal   Collection Time: 02/18/20  2:08 PM  Result Value Ref Range   WBC 11.5 (H) 4.0 - 10.5 K/uL   RBC 4.84 4.22 - 5.81 MIL/uL   Hemoglobin 15.4 13.0 - 17.0 g/dL   HCT 47.8 39.0 - 52.0 %   MCV 98.8 80.0 - 100.0 fL   MCH 31.8 26.0 - 34.0 pg   MCHC 32.2 30.0 - 36.0 g/dL   RDW 13.0 11.5 - 15.5 %   Platelets 224 150 - 400 K/uL   nRBC 0.0 0.0 - 0.2 %   Neutrophils Relative % 84 %   Neutro Abs 9.6 (H) 1.7 - 7.7 K/uL   Lymphocytes Relative 11 %   Lymphs Abs 1.2 0.7 - 4.0 K/uL   Monocytes Relative 5 %   Monocytes Absolute 0.6 0.1 - 1.0 K/uL   Eosinophils Relative 0 %   Eosinophils Absolute 0.0 0.0 - 0.5 K/uL   Basophils Relative 0 %   Basophils Absolute 0.1 0.0 - 0.1 K/uL   Immature Granulocytes 0 %   Abs Immature Granulocytes 0.03 0.00 - 0.07 K/uL    Comment: Performed at [facility], [address], [place], [place] [address]  Comprehensive metabolic panel     Status: Abnormal   Collection Time: 02/18/20  2:08 PM  Result Value Ref Range    Sodium 138 135 - 145 mmol/L   Potassium 3.5 3.5 - 5.1 mmol/L   Chloride 108 98 - 111 mmol/L   CO2 19 (L) 22 - 32 mmol/L   Glucose, Bld 129 (H) 70 - 99 mg/dL   BUN 16 8 - 23 mg/dL   Creatinine, Ser 1.20 0.61 - 1.24 mg/dL   Calcium 8.0 (L) 8.9 - 10.3 mg/dL   Total Protein 6.1 (L) 6.5 - 8.1 g/dL   Albumin 3.7 3.5 - 5.0 g/dL   AST 19 15 - 41 U/L   ALT 17 0 - 44 U/L   Alkaline Phosphatase 54 38 - 126 U/L   Total Bilirubin 1.2 0.3 - 1.2 mg/dL   GFR calc non Af Amer >60 >60 mL/min   GFR calc Af Amer >60 >60 mL/min   Anion gap 11 5 - 15    Comment: Performed at [facility] [address] [place], [place] [address]  Lipase, blood     Status: None   Collection Time: 02/18/20  2:08 PM  Result Value Ref Range   Lipase 26 11 - 51 U/L    Comment: Performed at [facility]  [address] [place], [place] [address]  HIV Antibody (routine testing w rflx)     Status: None   Collection Time: 02/18/20  5:04 PM  Result Value Ref Range   HIV Screen 4th Generation wRfx NON REACTIVE NON REACTIVE    Comment: Performed at [facility] [address], [place], [place] [address]  Respiratory Panel by RT PCR (Flu A&B, Covid) - Nasopharyngeal Swab     Status: None   Collection Time: 02/18/20  5:06 PM   Specimen: Nasopharyngeal Swab  Result Value Ref Range   SARS Coronavirus 2 by RT PCR NEGATIVE NEGATIVE    Comment: (NOTE) SARS-CoV-2 target nucleic acids are NOT DETECTED. The SARS-CoV-2 RNA is generally detectable in upper respiratoy specimens during the acute phase of infection. The lowest concentration of SARS-CoV-2 viral copies this assay can detect is 131 copies/mL. A negative result does not preclude SARS-Cov-2 infection and should not be used as the sole basis for treatment or other patient management decisions. A negative result may occur with  improper specimen collection/handling, submission of specimen other than nasopharyngeal swab, presence of viral mutation(s) within  the areas targeted by this assay, and inadequate number of viral copies (<131 copies/mL). A negative result must be combined with clinical observations, patient history, and epidemiological information. The expected result is Negative. Fact Sheet for Patients:  PinkCheek.be Fact Sheet for Healthcare Providers:  GravelBags.it This test is not yet ap proved or cleared by the [place] FDA and  has been authorized for detection and/or diagnosis of SARS-CoV-2 by FDA under an Emergency Use Authorization (EUA). This EUA will remain  in effect (meaning this test can be used) for the duration of the COVID-19 declaration under Section 564(b)(1) of the Act, 21 U.S.C. section 360bbb-3(b)(1), unless the authorization is terminated or revoked sooner.    Influenza A by PCR NEGATIVE NEGATIVE   Influenza B by PCR NEGATIVE NEGATIVE    Comment: (NOTE) The Xpert Xpress SARS-CoV-2/FLU/RSV assay is intended as an aid in  the diagnosis of influenza from Nasopharyngeal swab specimens and  should not be used as a sole basis for treatment. Nasal washings and  aspirates are unacceptable for Xpert Xpress SARS-CoV-2/FLU/RSV  testing. Fact Sheet for Patients: PinkCheek.be Fact Sheet for Healthcare Providers: GravelBags.it This test is not yet approved or cleared by the [place] FDA and  has been authorized for detection and/or diagnosis of SARS-CoV-2 by  FDA under an Emergency Use Authorization (EUA). This EUA will remain  in effect (meaning this test can be used) for the duration of the  Covid-19 declaration under Section 564(b)(1) of the Act, 21  U.S.C. section 360bbb-3(b)(1), unless the authorization is  terminated or revoked. Performed at [facility], [place] [address], [place], [place] [id number]   Basic metabolic panel     Status: Abnormal   Collection Time: 02/19/20  3:11 AM   Result Value Ref Range   Sodium 137 135 - 145 mmol/L   Potassium 3.9 3.5 - 5.1 mmol/L   Chloride 105 98 - 111 mmol/L   CO2 20 (L) 22 - 32 mmol/L   Glucose, Bld 189 (H) 70 - 99 mg/dL   BUN 16 8 - 23 mg/dL   Creatinine, Ser 1.23 0.61 - 1.24 mg/dL   Calcium 8.5 (L) 8.9 - 10.3 mg/dL   GFR calc non Af Amer 59 (L) >60 mL/min   GFR calc Af Amer >60 >60 mL/min   Anion gap 12 5 - 15  Comment: Performed at [facility], [place] [address] [place], [place] [address]  CBC     Status: None   Collection Time: 02/19/20  3:11 AM  Result Value Ref Range   WBC 9.8 4.0 - 10.5 K/uL   RBC 4.57 4.22 - 5.81 MIL/uL   Hemoglobin 14.7 13.0 - 17.0 g/dL   HCT 44.2 39.0 - 52.0 %   MCV 96.7 80.0 - 100.0 fL   MCH 32.2 26.0 - 34.0 pg   MCHC 33.3 30.0 - 36.0 g/dL   RDW 13.3 11.5 - 15.5 %   Platelets 162 150 - 400 K/uL    Comment: REPEATED TO VERIFY   nRBC 0.0 0.0 - 0.2 %    Comment: Performed at [facility], [place] [address], [place], [place] [address]    Radiology/Results: CT ABDOMEN PELVIS W CONTRAST  Result Date: 02/18/2020 CLINICAL DATA:  Acute nonlocalized abdominal pain. The pain radiates into the right testicle. History of kidney stones. EXAM: CT ABDOMEN AND PELVIS WITH CONTRAST TECHNIQUE: Multidetector CT imaging of the abdomen and pelvis was performed using the standard protocol following bolus administration of intravenous contrast. CONTRAST:  150mL OMNIPAQUE IOHEXOL 300 MG/ML  SOLN COMPARISON:  None. FINDINGS: Lower chest: No acute abnormality. Hepatobiliary: Hepatic steatosis. 14 mm cyst in the inferior aspect of the left lobe. Biliary tree is normal. Pancreas: Unremarkable. No pancreatic ductal dilatation or surrounding inflammatory changes. Spleen: Normal in size without focal abnormality. Adrenals/Urinary Tract: Normal adrenal glands. 8 mm cyst in the lower pole of the left kidney. Eleven and 8 and 3 mm cyst in the lower pole of the right kidney. No hydronephrosis. Bladder is normal.  Stomach/Bowel: Patient has acute appendicitis. The appendix is markedly distended to a diameter of 18 mm with extensive periappendiceal soft tissue stranding with some free fluid in the pelvis and extending in the right pericolic gutter and across the midline. No discrete abscess or free air. Terminal ileum appears normal. Stomach is normal. Multiple diverticula in the left side of the colon. Vascular/Lymphatic: Aortic atherosclerosis. No enlarged abdominal or pelvic lymph nodes. Reproductive: Prostate is unremarkable. Other: No abdominal wall hernia. Musculoskeletal: No acute abnormality. Bilateral pars defects at L5 with degenerative disc disease at L5-S1. Minimal spondylolisthesis at L5-S1. IMPRESSION: 1. Acute appendicitis with extensive periappendiceal soft tissue stranding and free fluid in the pelvis and right pericolic gutter. 2. Hepatic steatosis. 3. Diverticulosis of the left side of the colon. 4. Aortic atherosclerosis. 5. Bilateral pars defects at L5 with degenerative disc disease at L5-S1 and minimal spondylolisthesis at L5-S1. 6. Critical Value/emergent results were called by telephone at the time of interpretation on 02/18/2020 at ~4: 00 pm to provider Blanchie Dessert, MD , who verbally acknowledged these results. Aortic Atherosclerosis (ICD10-I70.0). Electronically Signed   By: Lorriane Shire M.D.   On: 02/18/2020 16:08   DG Chest Port 1 View  Result Date: 02/18/2020 CLINICAL DATA:  Rapid heart rate EXAM: PORTABLE CHEST 1 VIEW COMPARISON:  June 15, 2015 FINDINGS: The heart, hila, and mediastinum are unremarkable. Mild interstitial prominence. This is a low volume portable technique. No nodules or masses. No pneumothorax. Mild atelectasis in the bases. No focal infiltrates. IMPRESSION: Mild interstitial prominence may represent mild pulmonary venous congestion versus vascular crowding from low lung volumes. No suspicious infiltrates are identified. Mild atelectasis in the bases. Electronically  Signed   By: Dorise Bullion III M.D   On: 02/18/2020 17:24    Anti-infectives: Anti-infectives (From admission, onward)   Start  Dose/Rate Route Frequency Ordered Stop   02/19/20 2300  piperacillin-tazobactam (ZOSYN) IVPB 3.375 g  Status:  Discontinued     3.375 g 12.5 mL/hr over 240 Minutes Intravenous Every 8 hours 02/18/20 2242 02/18/20 2243   02/18/20 2300  piperacillin-tazobactam (ZOSYN) IVPB 3.375 g     3.375 g 12.5 mL/hr over 240 Minutes Intravenous Every 8 hours 02/18/20 2243     02/18/20 1615  cefTRIAXone (ROCEPHIN) 2 g in sodium chloride 0.9 % 100 mL IVPB     2 g 200 mL/hr over 30 Minutes Intravenous  Once 02/18/20 1606 02/18/20 1825   02/18/20 1615  metroNIDAZOLE (FLAGYL) IVPB 500 mg     500 mg 100 mL/hr over 60 Minutes Intravenous  Once 02/18/20 1606 02/18/20 1817      Assessment/Plan: Problem List: Patient Active Problem List   Diagnosis Date Noted  . Acute appendicitis 02/18/2020  . Perforated appendicitis 02/18/2020    He may be taking some clears but I expect him to have an ileus and at risk for intraabdominal abscess.   2 Days Post-Op    LOS: 2 days   Matt B. Hassell Done, MD, [facility] [phone] beeper [phone]  02/20/2020 9:29 AM

## 2020-02-21 LAB — CBC WITH DIFFERENTIAL/PLATELET
Abs Immature Granulocytes: 0.07 10*3/uL (ref 0.00–0.07)
Basophils Absolute: 0 10*3/uL (ref 0.0–0.1)
Basophils Relative: 0 %
Eosinophils Absolute: 0 10*3/uL (ref 0.0–0.5)
Eosinophils Relative: 0 %
HCT: 42.5 % (ref 39.0–52.0)
Hemoglobin: 14.3 g/dL (ref 13.0–17.0)
Immature Granulocytes: 1 %
Lymphocytes Relative: 6 %
Lymphs Abs: 0.7 10*3/uL (ref 0.7–4.0)
MCH: 32 pg (ref 26.0–34.0)
MCHC: 33.6 g/dL (ref 30.0–36.0)
MCV: 95.1 fL (ref 80.0–100.0)
Monocytes Absolute: 0.6 10*3/uL (ref 0.1–1.0)
Monocytes Relative: 5 %
Neutro Abs: 10.3 10*3/uL — ABNORMAL HIGH (ref 1.7–7.7)
Neutrophils Relative %: 88 %
Platelets: 213 10*3/uL (ref 150–400)
RBC: 4.47 MIL/uL (ref 4.22–5.81)
RDW: 13.3 % (ref 11.5–15.5)
WBC: 11.7 10*3/uL — ABNORMAL HIGH (ref 4.0–10.5)
nRBC: 0 % (ref 0.0–0.2)

## 2020-02-21 NOTE — Progress Notes (Signed)
Patient ID: Steven Ellison, male   DOB: 03/05/1951, 69 y.o.   MRN: [id number] [facility] Surgery Progress Note:   3 Days Post-Op  Subjective: Mental status is clear.  Relieved by NG Objective: Vital signs in last 24 hours: Temp:  [97.7 F (36.5 C)-99 F (37.2 C)] 98.8 F (37.1 C) (02/21 0557) Pulse Rate:  [75-81] 81 (02/21 0557) Resp:  [19-20] 20 (02/21 0557) BP: (138-154)/(70-84) 151/74 (02/21 0557) SpO2:  [91 %-93 %] 91 % (02/21 0557)  Intake/Output from previous day: 02/20 0701 - 02/21 0700 In: 2550.1 [P.O.:50; I.V.:2211.9; IV Piggyback:288.2] Out: 2975 [Urine:1350; Emesis/NG output:1450; Drains:175] Intake/Output this shift: No intake/output data recorded.  Physical Exam: Work of breathing is normal.  JP is more serosanguinous.  Umbilical incision has been marked but no erythema   Lab Results:  Results for orders placed or performed during the hospital encounter of 02/18/20 (from the past 48 hour(s))  CBC with Differential/Platelet     Status: Abnormal   Collection Time: 02/21/20  3:11 AM  Result Value Ref Range   WBC 11.7 (H) 4.0 - 10.5 K/uL   RBC 4.47 4.22 - 5.81 MIL/uL   Hemoglobin 14.3 13.0 - 17.0 g/dL   HCT 42.5 39.0 - 52.0 %   MCV 95.1 80.0 - 100.0 fL   MCH 32.0 26.0 - 34.0 pg   MCHC 33.6 30.0 - 36.0 g/dL   RDW 13.3 11.5 - 15.5 %   Platelets 213 150 - 400 K/uL   nRBC 0.0 0.0 - 0.2 %   Neutrophils Relative % 88 %   Neutro Abs 10.3 (H) 1.7 - 7.7 K/uL   Lymphocytes Relative 6 %   Lymphs Abs 0.7 0.7 - 4.0 K/uL   Monocytes Relative 5 %   Monocytes Absolute 0.6 0.1 - 1.0 K/uL   Eosinophils Relative 0 %   Eosinophils Absolute 0.0 0.0 - 0.5 K/uL   Basophils Relative 0 %   Basophils Absolute 0.0 0.0 - 0.1 K/uL   Immature Granulocytes 1 %   Abs Immature Granulocytes 0.07 0.00 - 0.07 K/uL    Comment: Performed at [facility], [address], [place], [place] [address]    Radiology/Results: DG Abd Portable 1V  Result Date: 02/20/2020 CLINICAL DATA:  NG  tube placement. EXAM: PORTABLE ABDOMEN - 1 VIEW COMPARISON:  CT abdomen 02/18/2020 FINDINGS: NG tube is coiled once over the stomach with tip right of midline likely over the distal stomach or proximal duodenum. Multiple air-filled partially visualized dilated small bowel loops measuring up to 4.2 cm in diameter. No free peritoneal air. Mild bibasilar opacification likely atelectasis. IMPRESSION: Multiple air-filled dilated small bowel loops. Findings are likely due to postoperative ileus in this patient with recent appendectomy 02/18/2020. NG tube tip right of midline likely over the distal stomach or proximal duodenum. Electronically Signed   By: Marin Olp M.D.   On: 02/20/2020 12:37    Anti-infectives: Anti-infectives (From admission, onward)   Start     Dose/Rate Route Frequency Ordered Stop   02/19/20 2300  piperacillin-tazobactam (ZOSYN) IVPB 3.375 g  Status:  Discontinued     3.375 g 12.5 mL/hr over 240 Minutes Intravenous Every 8 hours 02/18/20 2242 02/18/20 2243   02/18/20 2300  piperacillin-tazobactam (ZOSYN) IVPB 3.375 g     3.375 g 12.5 mL/hr over 240 Minutes Intravenous Every 8 hours 02/18/20 2243     02/18/20 1615  cefTRIAXone (ROCEPHIN) 2 g in sodium chloride 0.9 % 100 mL IVPB     2  g 200 mL/hr over 30 Minutes Intravenous  Once 02/18/20 1606 02/18/20 1825   02/18/20 1615  metroNIDAZOLE (FLAGYL) IVPB 500 mg     500 mg 100 mL/hr over 60 Minutes Intravenous  Once 02/18/20 1606 02/18/20 1817      Assessment/Plan: Problem List: Patient Active Problem List   Diagnosis Date Noted  . Acute appendicitis 02/18/2020  . Perforated appendicitis 02/18/2020    Would continue NG and observation.  Ileus from perforated appendix.   3 Days Post-Op    LOS: 3 days   Matt B. Hassell Done, MD, [facility] [phone] beeper [phone]  02/21/2020 9:12 AM

## 2020-02-22 LAB — SURGICAL PATHOLOGY

## 2020-02-22 MED ORDER — VITAMIN D 25 MCG (1000 UNIT) PO TABS
3000.0000 [IU] | ORAL_TABLET | Freq: Every day | ORAL | Status: DC
  Administered 2020-02-25 – 2020-02-27 (×2): 3000 [IU] via ORAL
  Filled 2020-02-22 (×2): qty 3

## 2020-02-22 MED ORDER — FENOFIBRATE 160 MG PO TABS
160.0000 mg | ORAL_TABLET | Freq: Every day | ORAL | Status: DC
  Administered 2020-02-25 – 2020-02-27 (×2): 160 mg via ORAL
  Filled 2020-02-22 (×2): qty 1

## 2020-02-22 NOTE — Progress Notes (Signed)
4 Days Post-Op    CC: Abdominal pain  Subjective: Feeling somewhat better with the NG in.  Still sore and tender.  Occasional flatus but not much.  He says he has not been up walking any.  JP drain is serous but cloudy.  Objective: Vital signs in last 24 hours: Temp:  [98.7 F (37.1 C)-98.8 F (37.1 C)] 98.7 F (37.1 C) (02/22 0559) Pulse Rate:  [64-79] 64 (02/22 0559) Resp:  [18-20] 18 (02/22 0559) BP: (150-162)/(76-80) 162/76 (02/22 0559) SpO2:  [93 %-96 %] 94 % (02/22 0559) Last BM Date: 02/18/20 50 p.o. 1550 urine NG 2275 Drains 245 No IVs recorded Afebrile vital signs are stable WBC 11.7 (2/21) No labs today  Intake/Output from previous day: 02/21 0701 - 02/22 0700 In: 51 [P.O.:50] Out: [id number] [Urine:1550; Emesis/NG output:2275; Drains:245] Intake/Output this shift: Total I/O In: -  Out: 330 [Urine:300; Drains:30]  General appearance: alert, cooperative and no distress Resp: clear to auscultation bilaterally Cardio: Regular rate and rhythm Extremities: extremities normal, atraumatic, no cyanosis or edema  Lab Results:  Recent Labs    02/21/20 0311  WBC 11.7*  HGB 14.3  HCT 42.5  PLT 213    BMET No results for input(s): NA, K, CL, CO2, GLUCOSE, BUN, CREATININE, CALCIUM in the last 72 hours. PT/INR No results for input(s): LABPROT, INR in the last 72 hours.  Recent Labs  Lab 02/18/20 1408  AST 19  ALT 17  ALKPHOS 54  BILITOT 1.2  PROT 6.1*  ALBUMIN 3.7     Lipase     Component Value Date/Time   LIPASE 26 02/18/2020 1408     Medications: . Chlorhexidine Gluconate Cloth  6 each Topical Daily  . cholecalciferol  3,000 Units Oral Daily  . docusate sodium  200 mg Oral BID  . dorzolamide-timolol  1 drop Right Eye BID  . fenofibrate  160 mg Oral Daily  . heparin injection (subcutaneous)  5,000 Units Subcutaneous Q8H  . lisinopril  20 mg Oral Daily  . Netarsudil-Latanoprost  1 drop Right Eye Daily  . PARoxetine  25 mg Oral q morning - 10a     Assessment/Plan Hypertension COPD/Hx tobacco use x33 years BPH Chronic back pain Hx retinal detachment  Acute perforated appendicitis with purulent peritonitis Laparoscopic partial cecetomy with appendectomy, 19 French Blake drain placement 02/18/2020 Dr. Nadeen Landau POD #4.  FEN: IV fluids/clears -sips and chips right now ID: Rocephin/Flagyl preop 2/18; Zosyn 2/18>> day 2 DVT: Heparin Follow-up: Dr. Nadeen Landau Foley: Pain: Tylenol 1 g q6h; no other pain med since MN  Plan: Mobilize more.  Continue IV fluids/antibiotics/await bowel function return.  Recheck BMP in the morning.   LOS: 4 days    Ever Halberg 02/22/2020 Please see Amion

## 2020-02-23 LAB — BASIC METABOLIC PANEL
Anion gap: 7 (ref 5–15)
BUN: 19 mg/dL (ref 8–23)
CO2: 24 mmol/L (ref 22–32)
Calcium: 8.1 mg/dL — ABNORMAL LOW (ref 8.9–10.3)
Chloride: 107 mmol/L (ref 98–111)
Creatinine, Ser: 0.96 mg/dL (ref 0.61–1.24)
GFR calc Af Amer: >60 mL/min (ref >60)
GFR calc non Af Amer: >60 mL/min (ref >60)
Glucose, Bld: 123 mg/dL — ABNORMAL HIGH (ref 70–99)
Potassium: 3.2 mmol/L — ABNORMAL LOW (ref 3.5–5.1)
Sodium: 138 mmol/L (ref 135–145)

## 2020-02-23 LAB — GLUCOSE, CAPILLARY: Glucose-Capillary: 128 mg/dL — ABNORMAL HIGH (ref 70–99)

## 2020-02-23 LAB — MAGNESIUM: Magnesium: 2.1 mg/dL (ref 1.7–2.4)

## 2020-02-23 MED ORDER — KCL IN DEXTROSE-NACL 40-5-0.9 MEQ/L-%-% IV SOLN
INTRAVENOUS | Status: AC
  Filled 2020-02-23 (×8): qty 1000

## 2020-02-23 NOTE — Progress Notes (Addendum)
5 Days Post-Op    CC: Abdominal pain  Subjective: He still does not feel very good.  No flatus or BM so far.  NG drainage 3300 yesterday.  Some of its ice chips but it still fairly green.  He has no bowel sounds.  Port sites all look fine.  Objective: Vital signs in last 24 hours: Temp:  [97.5 F (36.4 C)-99.1 F (37.3 C)] 98.4 F (36.9 C) (02/23 0516) Pulse Rate:  [65-70] 65 (02/23 0516) Resp:  [17-19] 19 (02/23 0516) BP: (150-167)/(68-76) 155/68 (02/23 0845) SpO2:  [94 %-100 %] 100 % (02/23 0516) Last BM Date: 02/18/20 N.p.o. 2760 IV 2000 and urine 3300 NG 210 from the drain Afebrile vital signs are stable K+ 3.2, Magnesium 2.1 Intake/Output from previous day: 02/22 0701 - 02/23 0700 In: 2760.4 [I.V.:2072.6; IV Piggyback:687.8] Out: 62 [Urine:2000; Emesis/NG output:3300; Drains:210] Intake/Output this shift: Total I/O In: 0  Out: 560 [Urine:510; Drains:50]  General appearance: alert, cooperative and no distress Resp: clear to auscultation bilaterally Cardio: Regular rate and rhythm  GI: Port sites all look fine.  Abdomen is distended.  He is not overly tender to palpation, no bowel sounds, no flatus, no BM so far Extremities: extremities normal, atraumatic, no cyanosis or edema  Lab Results:  Recent Labs    02/21/20 0311  WBC 11.7*  HGB 14.3  HCT 42.5  PLT 213    BMET Recent Labs    02/23/20 0119  NA 138  K 3.2*  CL 107  CO2 24  GLUCOSE 123*  BUN 19  CREATININE 0.96  CALCIUM 8.1*   PT/INR No results for input(s): LABPROT, INR in the last 72 hours.  Recent Labs  Lab 02/18/20 1408  AST 19  ALT 17  ALKPHOS 54  BILITOT 1.2  PROT 6.1*  ALBUMIN 3.7     Lipase     Component Value Date/Time   LIPASE 26 02/18/2020 1408     Medications: . Chlorhexidine Gluconate Cloth  6 each Topical Daily  . [START ON 02/25/2020] cholecalciferol  3,000 Units Oral Daily  . docusate sodium  200 mg Oral BID  . dorzolamide-timolol  1 drop Right Eye BID  .  [START ON 02/25/2020] fenofibrate  160 mg Oral Daily  . heparin injection (subcutaneous)  5,000 Units Subcutaneous Q8H  . lisinopril  20 mg Oral Daily  . Netarsudil-Latanoprost  1 drop Right Eye Daily  . PARoxetine  25 mg Oral q morning - 10a   . lactated ringers 100 mL/hr at 02/23/20 0104  . methocarbamol (ROBAXIN) IV Stopped (02/23/20 0539)  . piperacillin-tazobactam (ZOSYN)  IV 3.375 g (02/23/20 0551)    Assessment/Plan Hypertension COPD/Hx tobacco use x33 years BPH Chronic back pain Hx retinal detachment  Acute perforated appendicitis with purulent peritonitis Laparoscopic partialcecetomy with appendectomy, 19 French Blake drain placement 02/18/2020 Dr. Nadeen Landau POD #5  FEN: IV fluids/clears-sips and chips right now ID: Rocephin/Flagyl preop 2/18; Zosyn 2/18>>day 6 DVT: Heparin Follow-up: Dr. Nadeen Landau Foley: Pain: Tylenol 1 gq6h; no other pain med since MN   Plan: Continue the NG, work to mobilize more.  Continue antibiotics.  If he is not better we will repeat the CT scan tomorrow, and consider PICC line/TPN.  Replacing K+ via the IV, magnesium is pending and will replete that if needed.   LOS: 5 days    Baya Lentz 02/23/2020 Please see Amion

## 2020-02-23 NOTE — Progress Notes (Signed)
Pt ambulated in hall  With walker , Tolerated well. Safety measures in place . Continue to monitor pt

## 2020-02-24 LAB — PREALBUMIN: Prealbumin: 14.5 mg/dL — ABNORMAL LOW (ref 18–38)

## 2020-02-24 LAB — COMPREHENSIVE METABOLIC PANEL
ALT: 16 U/L (ref 0–44)
AST: 18 U/L (ref 15–41)
Albumin: 2.3 g/dL — ABNORMAL LOW (ref 3.5–5.0)
Alkaline Phosphatase: 56 U/L (ref 38–126)
Anion gap: 11 (ref 5–15)
BUN: 17 mg/dL (ref 8–23)
CO2: 21 mmol/L — ABNORMAL LOW (ref 22–32)
Calcium: 8.4 mg/dL — ABNORMAL LOW (ref 8.9–10.3)
Chloride: 110 mmol/L (ref 98–111)
Creatinine, Ser: 1.08 mg/dL (ref 0.61–1.24)
GFR calc Af Amer: >60 mL/min (ref >60)
GFR calc non Af Amer: >60 mL/min (ref >60)
Glucose, Bld: 154 mg/dL — ABNORMAL HIGH (ref 70–99)
Potassium: 3.7 mmol/L (ref 3.5–5.1)
Sodium: 142 mmol/L (ref 135–145)
Total Bilirubin: 1.3 mg/dL — ABNORMAL HIGH (ref 0.3–1.2)
Total Protein: 5.3 g/dL — ABNORMAL LOW (ref 6.5–8.1)

## 2020-02-24 LAB — CBC
HCT: 40.9 % (ref 39.0–52.0)
Hemoglobin: 13.9 g/dL (ref 13.0–17.0)
MCH: 31.8 pg (ref 26.0–34.0)
MCHC: 34 g/dL (ref 30.0–36.0)
MCV: 93.6 fL (ref 80.0–100.0)
Platelets: 291 10*3/uL (ref 150–400)
RBC: 4.37 MIL/uL (ref 4.22–5.81)
RDW: 13.3 % (ref 11.5–15.5)
WBC: 9 10*3/uL (ref 4.0–10.5)
nRBC: 0 % (ref 0.0–0.2)

## 2020-02-24 NOTE — Progress Notes (Signed)
6 Days Post-Op    CC: Abdominal pain  Subjective: He looks a little bit better this a.m.  I think his abdomen is softer.  The NG drainage is a lighter green color.  He has a few bowel sounds this a.m. that is new.  No BM or flatus so far.  Drainage is serosanguineous port sites look fine.  Objective: Vital signs in last 24 hours: Temp:  [97.8 F (36.6 C)-98.5 F (36.9 C)] 97.8 F (36.6 C) (02/24 0519) Pulse Rate:  [54-65] 54 (02/24 0519) Resp:  [18-22] 18 (02/24 0519) BP: (149-163)/(68-81) 163/75 (02/24 0519) SpO2:  [92 %-96 %] 96 % (02/24 0519) Last BM Date: 02/18/20 N.p.o. 241 IV recorded -IV rate 125 mL/HR Urine 20-60 NG 2020 Drain 155 Afebrile vital signs are stable A.m. labs shows potassium 3.7, glucose 154, LFTs are normal, total bilirubin 1.3, Prealbumin 14.5 WBC 9.0 Intake/Output from previous day: 02/23 0701 - 02/24 0700 In: 241.3 [I.V.:241.3] Out: [id number] [Urine:2260; Emesis/NG output:2020; Drains:155] Intake/Output this shift: No intake/output data recorded. Marland Kitchen dextrose 5 % and 0.9 % NaCl with KCl 40 mEq/L 125 mL/hr at 02/24/20 0614  . methocarbamol (ROBAXIN) IV 500 mg (02/24/20 0537)  . piperacillin-tazobactam (ZOSYN)  IV 3.375 g (02/24/20 0535)   General appearance: alert, cooperative and no distress Resp: clear to auscultation bilaterally Cardio: Regular rate and rhythm GI: Abdomen softer feels less distended this AM.  Port sites look fine.  Few bowel sounds, no flatus or BM.  NG drainage is a light green color. Extremities: extremities normal, atraumatic, no cyanosis or edema  Lab Results:  Recent Labs    02/24/20 0129  WBC 9.0  HGB 13.9  HCT 40.9  PLT 291    BMET Recent Labs    02/23/20 0119 02/24/20 0129  NA 138 142  K 3.2* 3.7  CL 107 110  CO2 24 21*  GLUCOSE 123* 154*  BUN 19 17  CREATININE 0.96 1.08  CALCIUM 8.1* 8.4*   PT/INR No results for input(s): LABPROT, INR in the last 72 hours.  Recent Labs  Lab 02/18/20 1408  02/24/20 0129  AST 19 18  ALT 17 16  ALKPHOS 54 56  BILITOT 1.2 1.3*  PROT 6.1* 5.3*  ALBUMIN 3.7 2.3*     Lipase     Component Value Date/Time   LIPASE 26 02/18/2020 1408     Medications: . Chlorhexidine Gluconate Cloth  6 each Topical Daily  . [START ON 02/25/2020] cholecalciferol  3,000 Units Oral Daily  . docusate sodium  200 mg Oral BID  . dorzolamide-timolol  1 drop Right Eye BID  . [START ON 02/25/2020] fenofibrate  160 mg Oral Daily  . heparin injection (subcutaneous)  5,000 Units Subcutaneous Q8H  . lisinopril  20 mg Oral Daily  . Netarsudil-Latanoprost  1 drop Right Eye Daily  . PARoxetine  25 mg Oral q morning - 10a    Assessment/Plan Hypertension COPD/Hx tobacco use x33 years BPH Chronic back pain Hx retinal detachment Moderate malnutrition  Acute perforated appendicitis with purulent peritonitis Laparoscopic partialcecetomy with appendectomy, 19 French Blake drain placement 02/18/2020 Dr. Beverely Pace #6  FEN: IV fluids/clears-sips and chips right now ID: Rocephin/Flagyl preop 2/18; Zosyn 2/18>>day 7 DVT: Heparin Follow-up: Dr. Nadeen Landau Foley: Pain: Tylenol 1 gq6h; no other pain med since MN   Plan: I does go to try some clamping trials today leave him on ice chips, continue to mobilize, see how he does.  LOS: 6 days    Ashyah Quizon 02/24/2020  Please see Amion

## 2020-02-25 ENCOUNTER — Inpatient Hospital Stay (HOSPITAL_COMMUNITY): Payer: Medicare Other

## 2020-02-25 ENCOUNTER — Inpatient Hospital Stay: Payer: Self-pay

## 2020-02-25 LAB — GLUCOSE, CAPILLARY
Glucose-Capillary: 130 mg/dL — ABNORMAL HIGH (ref 70–99)
Glucose-Capillary: 126 mg/dL — ABNORMAL HIGH (ref 70–99)

## 2020-02-25 MED ORDER — SODIUM CHLORIDE 0.9% FLUSH
10.0000 mL | INTRAVENOUS | Status: DC | PRN
  Administered 2020-02-26 – 2020-03-02 (×3): 10 mL

## 2020-02-25 MED ORDER — INSULIN ASPART 100 UNIT/ML ~~LOC~~ SOLN
0.0000 [IU] | Freq: Four times a day (QID) | SUBCUTANEOUS | Status: DC
  Administered 2020-02-25 – 2020-02-26 (×4): 1 [IU] via SUBCUTANEOUS
  Administered 2020-02-27: 13:00:00 2 [IU] via SUBCUTANEOUS
  Administered 2020-02-27 (×3): 1 [IU] via SUBCUTANEOUS
  Administered 2020-02-28 (×2): 2 [IU] via SUBCUTANEOUS
  Administered 2020-02-28: 19:00:00 1 [IU] via SUBCUTANEOUS
  Administered 2020-02-28: 07:00:00 2 [IU] via SUBCUTANEOUS
  Administered 2020-02-29 (×2): 1 [IU] via SUBCUTANEOUS

## 2020-02-25 MED ORDER — SODIUM CHLORIDE 0.9% FLUSH
10.0000 mL | Freq: Two times a day (BID) | INTRAVENOUS | Status: DC
  Administered 2020-02-25 – 2020-03-02 (×7): 10 mL

## 2020-02-25 MED ORDER — POTASSIUM CHLORIDE 10 MEQ/100ML IV SOLN
10.0000 meq | INTRAVENOUS | Status: AC
  Administered 2020-02-25 (×2): 10 meq via INTRAVENOUS
  Filled 2020-02-25: qty 100

## 2020-02-25 MED ORDER — HEPARIN SODIUM (PORCINE) 5000 UNIT/ML IJ SOLN
5000.0000 [IU] | Freq: Three times a day (TID) | INTRAMUSCULAR | Status: AC
  Administered 2020-02-25: 5000 [IU] via SUBCUTANEOUS
  Filled 2020-02-25: qty 1

## 2020-02-25 MED ORDER — IOHEXOL 350 MG/ML SOLN
100.0000 mL | Freq: Once | INTRAVENOUS | Status: AC | PRN
  Administered 2020-02-25: 16:00:00 100 mL via INTRAVENOUS

## 2020-02-25 MED ORDER — POTASSIUM CHLORIDE 10 MEQ/100ML IV SOLN: 10.0000 meq | INTRAVENOUS | Status: AC

## 2020-02-25 MED ORDER — TRAVASOL 10 % IV SOLN
INTRAVENOUS | Status: AC
  Filled 2020-02-25: qty 624

## 2020-02-25 MED ORDER — KCL IN DEXTROSE-NACL 40-5-0.9 MEQ/L-%-% IV SOLN
INTRAVENOUS | Status: AC
  Filled 2020-02-25: qty 1000

## 2020-02-25 NOTE — Progress Notes (Signed)
7 Days Post-Op    CC: Abdominal pain  Subjective: No improvement.  His abdomen is soft he is not really tender but he has no bowel sounds to speak no flatus or BM.  They just changed his NG tube cannot really tell how much he has had over the past 24 hours.  His ice chips were discontinued so the 1100 recorded must be completely without ice.  I flushed the NG again the sump is been occluded but the main portion of the tube is working well.  Objective: Vital signs in last 24 hours: Temp:  [96.8 F (36 C)-98.4 F (36.9 C)] 97.7 F (36.5 C) (02/25 0536) Pulse Rate:  [57-63] 57 (02/25 0536) Resp:  [15-17] 16 (02/25 0536) BP: (142-158)/(68-83) 158/71 (02/25 0536) SpO2:  [95 %-97 %] 95 % (02/25 0536) Last BM Date: 02/18/20 N.p.o. 4871 IV 1000 urine 1100 recorded for the NG Drains 135 Afebrile vital signs are stable Labs yesterday were stable. Intake/Output from previous day: 02/24 0701 - 02/25 0700 In: 4871.3 [I.V.:4349.1; IV Piggyback:522.1] Out: 2235 [Urine:1000; Emesis/NG output:1100; Drains:135] Intake/Output this shift: No intake/output data recorded.  General appearance: alert, cooperative and no distress Resp: clear to auscultation bilaterally and Few rales in the base. Cardio: Regular rate and rhythm GI: Abdomen soft, he is not tender to palpation, bowel sounds are hypoactive.  No flatus or BM. Extremities: extremities normal, atraumatic, no cyanosis or edema  Lab Results:  Recent Labs    02/24/20 0129  WBC 9.0  HGB 13.9  HCT 40.9  PLT 291    BMET Recent Labs    02/23/20 0119 02/24/20 0129  NA 138 142  K 3.2* 3.7  CL 107 110  CO2 24 21*  GLUCOSE 123* 154*  BUN 19 17  CREATININE 0.96 1.08  CALCIUM 8.1* 8.4*   PT/INR No results for input(s): LABPROT, INR in the last 72 hours.  Recent Labs  Lab 02/18/20 1408 02/24/20 0129  AST 19 18  ALT 17 16  ALKPHOS 54 56  BILITOT 1.2 1.3*  PROT 6.1* 5.3*  ALBUMIN 3.7 2.3*     Lipase     Component  Value Date/Time   LIPASE 26 02/18/2020 1408     Medications: . Chlorhexidine Gluconate Cloth  6 each Topical Daily  . cholecalciferol  3,000 Units Oral Daily  . docusate sodium  200 mg Oral BID  . dorzolamide-timolol  1 drop Right Eye BID  . fenofibrate  160 mg Oral Daily  . heparin injection (subcutaneous)  5,000 Units Subcutaneous Q8H  . lisinopril  20 mg Oral Daily  . Netarsudil-Latanoprost  1 drop Right Eye Daily  . PARoxetine  25 mg Oral q morning - 10a    Assessment/Plan Hypertension COPD/Hx tobacco use x33 years BPH Chronic back pain Hx retinal detachment Moderate malnutrition  Acute perforated appendicitis with purulent peritonitis Laparoscopic partialcecetomy with appendectomy, 19 French Blake drain placement 02/18/2020 Dr. Beverely Pace #7  FEN: IV fluids/clears-sips and chips right now ID: Rocephin/Flagyl preop 2/18; Zosyn 2/18>>day8 DVT: Heparin Follow-up: Dr. Nadeen Landau Foley: Pain: Tylenol 1 gq6h; no other pain med since MN  Plan: I get a get a CT with contrast this AM.  We will also place a PICC line and TPN.  Continue IV antibiotics.      LOS: 7 days    Steven Ellison 02/25/2020 Please see Amion

## 2020-02-25 NOTE — Plan of Care (Signed)

## 2020-02-25 NOTE — Progress Notes (Signed)
PHARMACY - TOTAL PARENTERAL NUTRITION CONSULT NOTE   Indication: Prolonged ileus  Patient Measurements: Height: 5\' 10"  (177.8 cm) Weight: 235 lb (106.6 kg) IBW/kg (Calculated) : 73 TPN AdjBW (KG): 81.4 Body mass index is 33.72 kg/m.  Assessment: 88yoM with hx of HTN, HLD, glaucoma, kidney stones, "mild copd" whom presented to ED today with 1d hx of lower quadrant abdominal pain that has steadily progressed. Now localized in RLQ. Surgery on 2/18 Acute perforated appendicitis with purulent peritonitis; Laparoscopic partialcecetomy with appendectomy, 19 French Blake drain placement. Now POD #7 with prolong post op ileus.   Glucose / Insulin: BG < 150; no diabetes history Electrolytes: Na 142; K 3.7 (replace), Mag 2.1 Renal: Scr 1.08, BUN WNL LFTs / TGs: LFTs WNL Prealbumin / albumin: Prealbumin 14.5 and albumin 2.3 Intake / Output; MIVF: UOP 0.4, NGT 1100 ml GI Imaging: None since TPN start Surgeries / Procedures: None since TPN start  Central access: PICC linw (to be placed 2/25) TPN start date: 2/25  Nutritional Goals (per PharmD calculations on 2/25): kCal: 2000 - 2200 , Protein: 120-130, Fluid: > 2 L  Goal TPN rate is 100 mL/hr (provides 125 g of protein and 2050 kcals per day)  Current Nutrition:  NPO  Plan:  Start TPN at 50 mL/hr at 1800 (provides 62.5 g of protein and 1025 kcals per day) Electrolytes in TPN: 92mEq/L of Na, 100mEq/L of K, 68mEq/L of Ca, 46mEq/L of Mg, and 52mmol/L of Phos. Cl:Ac ratio 1:1 Add standard MVI on MWF due to national shortage and trace elements to TPN Initiate Sensitive q6h SSI and adjust as needed  Reduce MIVF to 75 mL/hr at 1800 Monitor TPN labs tomorrow and on Mon/Thurs  Alanda Slim, PharmD, [place] Clinical Pharmacist Please see AMION for all Pharmacists' Contact Phone Numbers 02/25/2020, 9:28 AM

## 2020-02-25 NOTE — Progress Notes (Signed)
Patient ambulated unit x2 able to have full sentence conversations with shortness of breath. Tolerated ambulation well. Arthor Captain LPN

## 2020-02-25 NOTE — Progress Notes (Signed)
Peripherally Inserted Central Catheter/Midline Placement  The IV Nurse has discussed with the patient and/or persons authorized to consent for the patient, the purpose of this procedure and the potential benefits and risks involved with this procedure.  The benefits include less needle sticks, lab draws from the catheter, and the patient may be discharged home with the catheter. Risks include, but not limited to, infection, bleeding, blood clot (thrombus formation), and puncture of an artery; nerve damage and irregular heartbeat and possibility to perform a PICC exchange if needed/ordered by physician.  Alternatives to this procedure were also discussed.  Bard Power PICC patient education guide, fact sheet on infection prevention and patient information card has been provided to patient /or left at bedside.    PICC/Midline Placement Documentation  PICC Double Lumen [id number] PICC Right Basilic 46 cm 0 cm (Active)  Indication for Insertion or Continuance of Line Administration of hyperosmolar/irritating solutions (i.e. TPN, Vancomycin, etc.) 02/25/20 1400  Exposed Catheter (cm) 0 cm 02/25/20 1400  Site Assessment Clean;Dry;Intact 02/25/20 1400  Lumen #1 Status Flushed;Blood return noted 02/25/20 1400  Lumen #2 Status Flushed;Blood return noted 02/25/20 1400  Dressing Type Transparent 02/25/20 1400  Dressing Status Clean;Dry;Intact;Antimicrobial disc in place 02/25/20 1400  Dressing Change Due 03/03/20 02/25/20 1400       Jule Economy Horton 02/25/2020, 2:55 PM

## 2020-02-25 NOTE — Progress Notes (Signed)
CT scan is back and shows 2 fluid collections: 1.  3.7 x 2.0 x 3.1 cm posterior right pelvis just anterior to the right common iliac Artery. 2.  6.3 x 2.9 x 5.8 cm. rim enhancing fluid collection is identified along the right peritoneum/paracolic gutter measuring    Also ileus, no pneumatosis.  Bibasilar collapse/consolidation with small bilateral pleural effusions.  I have ask IR to review and see if they can drain these tomorrow.  He is NPO and labs ordered for the AM.

## 2020-02-25 NOTE — Progress Notes (Signed)
Initial Nutrition Assessment  DOCUMENTATION CODES:   Obesity unspecified  INTERVENTION:   -TPN management per pharmacy -RD will follow for diet advancement and supplement as appropriate  NUTRITION DIAGNOSIS:   Increased nutrient needs related to post-op healing as evidenced by estimated needs.  GOAL:   Patient will meet greater than or equal to 90% of their needs  MONITOR:   Diet advancement, TF tolerance, Labs, Skin, I & O's  REASON FOR ASSESSMENT:   Consult New TPN/TNA  ASSESSMENT:   Steven Ellison is a very pleasant 67yoM with hx of HTN, HLD, glaucoma, kidney stones, "mild copd" whom presented to ED today with 1d hx of lower quadrant abdominal pain that has steadily progressed. Now localized in RLQ. Denies ever having had this kind of pain before. Pain does not radiate. Nothing makes it better/worse. Associated with chills. Denies n/v. Denies blood in stool. Denies weight loss.  Pt admitted with acute appendicitis.   2/18- s/p PROCEDURE: Laparoscopic partial cecectomy (with appendectomy) 2/20- NGT placed  Reviewed I/O's: +2.6 L x 24 hours and -4.9 L since admission  UOP: 1 L x 24 hours  NGT output: 1.1 L x 24 hours  Drain output: 135 ml x 24 hours  Spoke with pt at bedside, who reports feeling a little better today. He confirmed he had a BM earlier today, but still feels "tight". PTA, pt reports good appetite, typically consuming 2 meals per day, as well as eating a lot of sweets. Oral intake was poor 2 days PTA (pt reports last PO intake other than water was 2 days PTA; did not eat one day PTA due to feeling poorly).   Pt shares his UBW is around 220#. He denies any weight loss PTA, but suspects he has lost weight due to prolonged NPO status. RD discussed rationale for NGT and NPO order. Discussed how pt will receive nutrition (TPN) and possibility of diet progression once NGT is removed.   Plan for PICC to be placed today. Per pharmacy notes, plan to start TPN at 50  ml/hr, which provides 1025 kcals and 63 grams protein, meeting 45% of estimated kcal needs and 50% of estimated protein needs.  Medications reviewed and include dextrose 5% and 0.9% NaCl with KCl 40 mEq/L infusion @ 125 ml/hr.   Labs reviewed: CBGS: 128 (inpatient orders for glycemic control are 0-9 units insulin aspart every 6 hours).   NUTRITION - FOCUSED PHYSICAL EXAM:    Most Recent Value  Orbital Region  Mild depletion  Upper Arm Region  No depletion  Thoracic and Lumbar Region  No depletion  Buccal Region  No depletion  Temple Region  Mild depletion  Clavicle Bone Region  No depletion  Clavicle and Acromion Bone Region  No depletion  Scapular Bone Region  No depletion  Dorsal Hand  No depletion  Patellar Region  No depletion  Anterior Thigh Region  No depletion  Posterior Calf Region  No depletion  Edema (RD Assessment)  Mild  Hair  Reviewed  Eyes  Reviewed  Mouth  Reviewed  Skin  Reviewed  Nails  Reviewed       Diet Order:   Diet Order            Diet NPO time specified Except for: Sips with Meds, Ice Chips  Diet effective now              EDUCATION NEEDS:   Education needs have been addressed  Skin:  Skin Assessment: Skin Integrity Issues: Skin Integrity  Issues:: Incisions Incisions: closed abdomen  Last BM:  02/17/20  Height:   Ht Readings from Last 1 Encounters:  02/18/20 5\' 10"  (1.778 m)    Weight:   Wt Readings from Last 1 Encounters:  02/18/20 106.6 kg    Ideal Body Weight:  75.5 kg  BMI:  Body mass index is 33.72 kg/m.  Estimated Nutritional Needs:   Kcal:  [id number]  Protein:  125-150 grams  Fluid:  > 2.3 L    Loistine Chance, RD, LDN, [place] Registered Dietitian II Certified Diabetes Care and Education Specialist Please refer to [facility] for RD and/or RD on-call/weekend/after hours pager

## 2020-02-25 NOTE — Progress Notes (Signed)
NG suction is turned off at [place]. Instructed patient to notify if feeling nauseated call bell within reach patient verbalized understanding. Arthor Captain LPN

## 2020-02-26 LAB — PREALBUMIN: Prealbumin: 19.4 mg/dL (ref 18–38)

## 2020-02-26 LAB — CBC
HCT: 41.2 % (ref 39.0–52.0)
Hemoglobin: 13.3 g/dL (ref 13.0–17.0)
MCH: 31.7 pg (ref 26.0–34.0)
MCHC: 32.3 g/dL (ref 30.0–36.0)
MCV: 98.1 fL (ref 80.0–100.0)
Platelets: 332 10*3/uL (ref 150–400)
RBC: 4.2 MIL/uL — ABNORMAL LOW (ref 4.22–5.81)
RDW: 13.8 % (ref 11.5–15.5)
WBC: 10.2 10*3/uL (ref 4.0–10.5)
nRBC: 0 % (ref 0.0–0.2)

## 2020-02-26 LAB — DIFFERENTIAL
Abs Immature Granulocytes: 0.13 10*3/uL — ABNORMAL HIGH (ref 0.00–0.07)
Basophils Absolute: 0.1 10*3/uL (ref 0.0–0.1)
Basophils Relative: 1 %
Eosinophils Absolute: 0.3 10*3/uL (ref 0.0–0.5)
Eosinophils Relative: 3 %
Immature Granulocytes: 1 %
Lymphocytes Relative: 13 %
Lymphs Abs: 1.4 10*3/uL (ref 0.7–4.0)
Monocytes Absolute: 0.7 10*3/uL (ref 0.1–1.0)
Monocytes Relative: 7 %
Neutro Abs: 7.6 10*3/uL (ref 1.7–7.7)
Neutrophils Relative %: 75 %

## 2020-02-26 LAB — BASIC METABOLIC PANEL
Anion gap: 9 (ref 5–15)
BUN: 12 mg/dL (ref 8–23)
CO2: 22 mmol/L (ref 22–32)
Calcium: 8.8 mg/dL — ABNORMAL LOW (ref 8.9–10.3)
Chloride: 108 mmol/L (ref 98–111)
Creatinine, Ser: 1.04 mg/dL (ref 0.61–1.24)
GFR calc Af Amer: >60 mL/min (ref >60)
GFR calc non Af Amer: >60 mL/min (ref >60)
Glucose, Bld: 148 mg/dL — ABNORMAL HIGH (ref 70–99)
Potassium: 3.8 mmol/L (ref 3.5–5.1)
Sodium: 139 mmol/L (ref 135–145)

## 2020-02-26 LAB — MAGNESIUM: Magnesium: 1.9 mg/dL (ref 1.7–2.4)

## 2020-02-26 LAB — GLUCOSE, CAPILLARY
Glucose-Capillary: 133 mg/dL — ABNORMAL HIGH (ref 70–99)
Glucose-Capillary: 134 mg/dL — ABNORMAL HIGH (ref 70–99)
Glucose-Capillary: 141 mg/dL — ABNORMAL HIGH (ref 70–99)
Glucose-Capillary: 146 mg/dL — ABNORMAL HIGH (ref 70–99)

## 2020-02-26 LAB — PROTIME-INR
INR: 1.1 (ref 0.8–1.2)
Prothrombin Time: 14.3 seconds (ref 11.4–15.2)

## 2020-02-26 LAB — TRIGLYCERIDES: Triglycerides: 232 mg/dL — ABNORMAL HIGH (ref <150)

## 2020-02-26 LAB — PHOSPHORUS: Phosphorus: 3.4 mg/dL (ref 2.5–4.6)

## 2020-02-26 MED ORDER — KCL IN DEXTROSE-NACL 40-5-0.9 MEQ/L-%-% IV SOLN
INTRAVENOUS | Status: DC
  Filled 2020-02-26 (×2): qty 1000

## 2020-02-26 MED ORDER — POTASSIUM CHLORIDE 10 MEQ/100ML IV SOLN
10.0000 meq | INTRAVENOUS | Status: AC
  Administered 2020-02-26 (×2): 10 meq via INTRAVENOUS
  Filled 2020-02-26: qty 100

## 2020-02-26 MED ORDER — TRAVASOL 10 % IV SOLN
INTRAVENOUS | Status: AC
  Filled 2020-02-26: qty 936

## 2020-02-26 NOTE — Progress Notes (Signed)
IR consulted for aspiration and drainage of intra-abdominal fluid collections.   Imaging reviewed by Dr. Earleen Newport who notes a small lateral collection as well as a central collection encase by bowel.  Central collection not approachable.  Recommends continues to monitor as the lateral collection is very small at present.   Brynda Greathouse, MS RD PA-C

## 2020-02-26 NOTE — Progress Notes (Signed)
PHARMACY - TOTAL PARENTERAL NUTRITION CONSULT NOTE   Indication: Prolonged ileus  Patient Measurements: Height: 5\' 10"  (177.8 cm) Weight: 235 lb (106.6 kg) IBW/kg (Calculated) : 73 TPN AdjBW (KG): 81.4 Body mass index is 33.72 kg/m.  Assessment: 69 year old male with a past medical history significant for HTN, HLD, glaucoma, kidney stones, "mild copd" whom presented to ED 02/17/19 with a one day history of lower quadrant abdominal pain that has steadily progressed. Now localized in RLQ. Surgery on 2/18 Acute perforated appendicitis with purulent peritonitis; Laparoscopic partialcecetomy with appendectomy, 19 French Blake drain placement. Pharmacy was consulted for TPN on POD #7 due to prolonged post op ileus.   Glucose / Insulin: BG < 150; no diabetes history Electrolytes: Na 139; K 3.8 (goal >4, currently receiving 72 mEq in IVF + 60 mEq in TPN for total of 132 mEq/day), Mag 1.9 (goal >2), Phos 3.4. CoCa 10.2 Renal: Scr 1.04, BUN within normal limits LFTs / TGs: LFTs within normal limits / TG up at 232.  Prealbumin / albumin: Prealbumin up 19.4 / albumin 2.3 Intake / Output; MIVF: UOP 0.9, NGT output up 2020 ml, JP drain output 155 mL, -4L last 24 hrs GI Imaging: None since TPN start Surgeries / Procedures: None since TPN start  Central access: PICC linw (to be placed 2/25) TPN start date: 2/25  Nutritional Goals (per PharmD calculations on 2/25): kCal: 2000 - 2200 , Protein: 120-130, Fluid: > 2 L  Goal TPN rate is 100 mL/hr (provides 125 g of protein and 2050 kcals per day)  Current Nutrition:  NPO  Plan:  Increase TPN at 75 mL/hr at 1800 (provides 94 g of protein and 1588 kcals per day) Electrolytes in TPN: 61mEq/L of Na, 48mEq/L of K, 66mEq/L of Ca, 74mEq/L of Mg, and 30mmol/L of Phos. Cl:Ac ratio 1:1 Add standard MVI on MWF due to national shortage and trace elements to TPN Continue Sensitive q6h SSI and adjust as needed  Reduce MIVF to 75 mL/hr at 1800 - *TPN + IVF will  provide total of 138 mEq KCl per day Monitor TPN labs daily while titration up of TPN and on Mon/Thurs  Magnesium 1g IV today KCl 10 mEq IV x2 today  Sloan Leiter, PharmD, BCPS, BCCCP Clinical Pharmacist Clinical phone 02/26/2020 until 3PM [phone] Please refer to [facility] for [place] numbers 02/26/2020, 10:34 AM

## 2020-02-26 NOTE — Progress Notes (Signed)
Nutrition Follow-up  RD working remotely.  DOCUMENTATION CODES:   Obesity unspecified  INTERVENTION:   -TPN management per pharmacy -RD will follow for diet advancement and supplement as appropriate  NUTRITION DIAGNOSIS:   Increased nutrient needs related to post-op healing as evidenced by estimated needs.  Ongoing  GOAL:   Patient will meet greater than or equal to 90% of their needs  Progressing  MONITOR:   Diet advancement, TF tolerance, Labs, Skin, I & O's  REASON FOR ASSESSMENT:   Consult New TPN/TNA  ASSESSMENT:   Steven Ellison is a very pleasant 45yoM with hx of HTN, HLD, glaucoma, kidney stones, "mild copd" whom presented to ED today with 1d hx of lower quadrant abdominal pain that has steadily progressed. Now localized in RLQ. Denies ever having had this kind of pain before. Pain does not radiate. Nothing makes it better/worse. Associated with chills. Denies n/v. Denies blood in stool. Denies weight loss.  2/18- s/p PROCEDURE:Laparoscopic partial cecectomy (with appendectomy) 2/20- NGT placed 2/25- PICC placed, TPN initiated  Reviewed I/O's: -664 ml x 24 hours and -5.5 L since admission  UOP: 1 ml x 24 hours  NGT output: 2 L x 24 hours  Drain output: 35 ml x 24 hours  Per general surgery notes, CT scan on 02/25/20 revealed fluid collections on rt pelvis and right peritoneum/paracolic. Per IR notes, collections are small and not approachable.   Pt receiving TPN at 50 ml/hr, which provides 1025 kcals and 63 grams protein, meeting 45% of estimated kcal needs and 50% of estimated protein needs. Per pharmacy note, plan to increase TPN to 75 mL/hr at 1800, which provides 1588 kcals and 94 grams protein daily, meeting 69% of estimated kcal needs and 75% of estimated protein needs.  Labs reviewed: K, Mg, and PHOS WDL. CBGS: 126-134 (inpatient orders for glycemic control are 0-9 units insulin aspart every 6 hours).   Diet Order:   Diet Order            Diet NPO  time specified Except for: Ice Chips  Diet effective midnight              EDUCATION NEEDS:   Education needs have been addressed  Skin:  Skin Assessment: Skin Integrity Issues: Skin Integrity Issues:: Incisions Incisions: closed abdomen  Last BM:  02/25/20  Height:   Ht Readings from Last 1 Encounters:  02/18/20 5\' 10"  (1.778 m)    Weight:   Wt Readings from Last 1 Encounters:  02/18/20 106.6 kg    Ideal Body Weight:  75.5 kg  BMI:  Body mass index is 33.72 kg/m.  Estimated Nutritional Needs:   Kcal:  [id number]  Protein:  125-150 grams  Fluid:  > 2.3 L    Loistine Chance, RD, LDN, [place] Registered Dietitian II Certified Diabetes Care and Education Specialist Please refer to [facility] for RD and/or RD on-call/weekend/after hours pager

## 2020-02-26 NOTE — Progress Notes (Signed)
8 Days Post-Op    CC: Abdominal pain  Subjective: He failed a clamping trials and put out 2 L through his NG yesterday.  Has been n.p.o. since midnight so all the fluid currently  is without ice.  It is dark green and looks gastric.  IR has reviewed the CT and their recommendation that we do not try and place drains at this time.  He was started on TPN yesterday.  We talked about his pleural effusions and atelectasis and told him to work more on his incentive spirometry.  Objective: Vital signs in last 24 hours: Temp:  [97.6 F (36.4 C)-98.2 F (36.8 C)] 98.1 F (36.7 C) (02/26 0533) Pulse Rate:  [56-67] 56 (02/26 0533) Resp:  [16-18] 16 (02/26 0533) BP: (145-155)/(73-89) 154/73 (02/26 0533) SpO2:  [94 %-96 %] 94 % (02/26 0533) Last BM Date: 02/25/20 1372 IV Voided x1 recorded NG 2000 35 from the drain BM x1 Afebrile vital signs are stable BMP stable Prealbumin 19.4 Triglycerides 232 WBC 10.2 INR 1.1 CT scan is back and shows 2 fluid collections: 1.  3.7 x 2.0 x 3.1 cm posterior right pelvis just anterior to the right common iliac Artery. 2.  6.3 x 2.9 x 5.8 cm. rim enhancing fluid collection is identified along the right peritoneum/paracolic gutter measuring  3.ileus, no pneumatosis.  Bibasilar collapse/consolidation with small bilateral pleural effusions.   Intake/Output from previous day: 02/25 0701 - 02/26 0700 In: 1372 [I.V.:1172; IV Piggyback:200] Out: 2036 [Urine:1; Emesis/NG output:2000; Drains:35] Intake/Output this shift: No intake/output data recorded.  General appearance: alert, cooperative and no distress Resp: clear to auscultation bilaterally Cardio: Regular rate and rhythm GI: Soft, still distended, few hypoactive bowel sounds.  Port sites all look fine. Extremities: extremities normal, atraumatic, no cyanosis or edema  Lab Results:  Recent Labs    02/24/20 0129 02/26/20 0405  WBC 9.0 10.2  HGB 13.9 13.3  HCT 40.9 41.2  PLT 291 332     BMET Recent Labs    02/24/20 0129 02/26/20 0405  NA 142 139  K 3.7 3.8  CL 110 108  CO2 21* 22  GLUCOSE 154* 148*  BUN 17 12  CREATININE 1.08 1.04  CALCIUM 8.4* 8.8*   PT/INR Recent Labs    02/26/20 0405  LABPROT 14.3  INR 1.1    Recent Labs  Lab 02/24/20 0129  AST 18  ALT 16  ALKPHOS 56  BILITOT 1.3*  PROT 5.3*  ALBUMIN 2.3*     Lipase     Component Value Date/Time   LIPASE 26 02/18/2020 1408     Medications: . Chlorhexidine Gluconate Cloth  6 each Topical Daily  . cholecalciferol  3,000 Units Oral Daily  . docusate sodium  200 mg Oral BID  . dorzolamide-timolol  1 drop Right Eye BID  . fenofibrate  160 mg Oral Daily  . insulin aspart  0-9 Units Subcutaneous Q6H  . lisinopril  20 mg Oral Daily  . Netarsudil-Latanoprost  1 drop Right Eye Daily  . PARoxetine  25 mg Oral q morning - 10a  . sodium chloride flush  10-40 mL Intracatheter Q12H    Assessment/Plan Hypertension COPD/Hx tobacco use x33 years BPH Chronic back pain Hx retinal detachment Moderate malnutrition  Acute perforated appendicitis with purulent peritonitis Laparoscopic partialcecetomy with appendectomy, 19 French Blake drain placement 02/18/2020 Dr. Beverely Pace #8  FEN: IV fluids/TPN/ice chips ID: Rocephin/Flagyl preop 2/18; Zosyn 2/18>>day9 DVT: Heparin Follow-up: Dr. Nadeen Landau Foley: Pain: Tylenol 1 gq6h; no other  pain med since MN   Plan: Continue TPN, continue NG decompression, continue antibiotics, mobilize, work on incentive spirometry.  Await bowel function return.  If he does not improve we will repeat his CT scan next week.  LOS: 8 days    Steven Ellison 02/26/2020 Please see Amion

## 2020-02-27 LAB — GLUCOSE, CAPILLARY
Glucose-Capillary: 127 mg/dL — ABNORMAL HIGH (ref 70–99)
Glucose-Capillary: 127 mg/dL — ABNORMAL HIGH (ref 70–99)
Glucose-Capillary: 142 mg/dL — ABNORMAL HIGH (ref 70–99)
Glucose-Capillary: 152 mg/dL — ABNORMAL HIGH (ref 70–99)

## 2020-02-27 LAB — BASIC METABOLIC PANEL
Anion gap: 9 (ref 5–15)
BUN: 16 mg/dL (ref 8–23)
CO2: 23 mmol/L (ref 22–32)
Calcium: 8.8 mg/dL — ABNORMAL LOW (ref 8.9–10.3)
Chloride: 109 mmol/L (ref 98–111)
Creatinine, Ser: 1.07 mg/dL (ref 0.61–1.24)
GFR calc Af Amer: >60 mL/min (ref >60)
GFR calc non Af Amer: >60 mL/min (ref >60)
Glucose, Bld: 148 mg/dL — ABNORMAL HIGH (ref 70–99)
Potassium: 4.1 mmol/L (ref 3.5–5.1)
Sodium: 141 mmol/L (ref 135–145)

## 2020-02-27 LAB — PHOSPHORUS: Phosphorus: 3.7 mg/dL (ref 2.5–4.6)

## 2020-02-27 LAB — MAGNESIUM: Magnesium: 2.1 mg/dL (ref 1.7–2.4)

## 2020-02-27 MED ORDER — TRAVASOL 10 % IV SOLN
INTRAVENOUS | Status: AC
  Filled 2020-02-27: qty 1440

## 2020-02-27 MED ORDER — ENOXAPARIN SODIUM 60 MG/0.6ML ~~LOC~~ SOLN
50.0000 mg | SUBCUTANEOUS | Status: DC
  Administered 2020-02-27 – 2020-03-04 (×7): 50 mg via SUBCUTANEOUS
  Filled 2020-02-27 (×7): qty 0.6

## 2020-02-27 NOTE — Progress Notes (Signed)
PHARMACY - TOTAL PARENTERAL NUTRITION CONSULT NOTE   Indication: Prolonged ileus  Patient Measurements: Height: 5\' 10"  (177.8 cm) Weight: 235 lb (106.6 kg) IBW/kg (Calculated) : 73 TPN AdjBW (KG): 81.4 Body mass index is 33.72 kg/m.  Assessment: 69 year old male with a past medical history significant for HTN, HLD, glaucoma, kidney stones, "mild copd" whom presented to ED 02/17/19 with a one day history of lower quadrant abdominal pain that has steadily progressed. Now localized in RLQ. Surgery on 2/18 Acute perforated appendicitis with purulent peritonitis; Laparoscopic partialcecetomy with appendectomy, 19 French Blake drain placement. Pharmacy was consulted for TPN on POD #7 due to prolonged post op ileus.   Glucose / Insulin: BG < 150; no diabetes history; 3 units of sSSI required last 24hrs Electrolytes: K 4.1 (goal >4, currently receiving 48 mEq in IVF + 90 mEq in TPN for total of 138 mEq/day)>> stopping IVF today with TPN rate increase, Mag 2.1 (goal >2), Phos 3.7. CoCa 10.2 Renal: Scr 1.07 (stable), BUN within normal limits LFTs / TGs: LFTs within normal limits / TG up at 232.  Prealbumin / albumin: Prealbumin up 19.4 / albumin 2.3 Intake / Output; MIVF: 3570/3480 - UOP adequate (not all measured), NGT output up 3050 ml, JP drain output down to 30 mL, on D5-NS + 40K at 50 ml/hr.  GI Imaging: None since TPN start Surgeries / Procedures: None since TPN start  Central access: PICC linw (to be placed 2/25) TPN start date: 2/25  Nutritional Goals (per Registered Dietician on 2/25): kCal: 2300-2500 , Protein: 125-150, Fluid: > 2.3 L  Goal TPN rate is 100 mL/hr (provides 144 g of protein and 2326 kcals per day)  Current Nutrition:  NPO/ice chips  Plan:  Increase TPN to 100 mL/hr at 1800 (provides 144 g of protein and 2326 kcals per day) - adjusted up for goals Electrolytes in TPN: 32mEq/L of Na, 26mEq/L of K (132 mEq/day or 5.5 mEq/hr), 7mEq/L of Ca, 74mEq/L of Mg, and 19mmol/L  of Phos. Cl:Ac ratio 1:1 Add standard MVI on MWF due to national shortage and trace elements to TPN Continue Sensitive q6h SSI and adjust as needed -- will consider reduction to q8h if maintains controlled CBGs at full rate.  Stop MIVF at 1800 due to increase in TPN rate Monitor TPN labs daily while titration up of TPN and on Mon/Thurs Monitor TG - next Monday    Sloan Leiter, PharmD, BCPS, BCCCP Clinical Pharmacist Clinical phone 02/27/2020 until 3PM [phone] Please refer to [facility] for [place] numbers 02/27/2020, 9:42 AM

## 2020-02-27 NOTE — Progress Notes (Signed)
9 Days Post-Op    CC: Abdominal pain  Subjective: No significant change.  Still large volume of NG output.  No flatus or BM so far.  He is voiding adequately, is just not being recorded.  Still has some basilar rales.  He is working on his incentive.  He is ambulating some.  Port sites all look fine.  Objective: Vital signs in last 24 hours: Temp:  [98 F (36.7 C)-99 F (37.2 C)] 98.2 F (36.8 C) (02/27 [id number]) Pulse Rate:  [55-60] 55 (02/27 0614) Resp:  [16-19] 18 (02/27 0614) BP: (149-157)/(71-84) 150/77 (02/27 0614) SpO2:  [94 %-97 %] 97 % (02/27 0614) Last BM Date: 02/26/20 90 p.m. 3400 IV 400 urine recorded; voided x4 3050 per NG 30 cc drain Afebrile vital signs are stable BMP this a.m. shows a potassium of 4.1 creatinine 1.07  Intake/Output from previous day: 02/26 0701 - 02/27 0700 In: 3569.6 [P.O.:90; I.V.:2796.4; IV Piggyback:683.2] Out: 3480 [Urine:400; Emesis/NG output:3050; Drains:30] Intake/Output this shift: No intake/output data recorded.  General appearance: alert, cooperative, no distress and Just looks tired and worn out. Resp: clear to auscultation bilaterally Cardio: Regular rate and rhythm GI: Soft he is not that tender he sitting up in the chair but he does not feel that distended.  NG tube in place draining green fluid he is taking some ice chips as some of this is ice.  No flatus or BM.  JP drain is serous fluid.  Port sites look fine. Extremities: extremities normal, atraumatic, no cyanosis or edema  Lab Results:  Recent Labs    02/26/20 0405  WBC 10.2  HGB 13.3  HCT 41.2  PLT 332    BMET Recent Labs    02/26/20 0405 02/27/20 0415  NA 139 141  K 3.8 4.1  CL 108 109  CO2 22 23  GLUCOSE 148* 148*  BUN 12 16  CREATININE 1.04 1.07  CALCIUM 8.8* 8.8*   PT/INR Recent Labs    02/26/20 0405  LABPROT 14.3  INR 1.1    Recent Labs  Lab 02/24/20 0129  AST 18  ALT 16  ALKPHOS 56  BILITOT 1.3*  PROT 5.3*  ALBUMIN 2.3*      Lipase     Component Value Date/Time   LIPASE 26 02/18/2020 1408     Medications: . Chlorhexidine Gluconate Cloth  6 each Topical Daily  . cholecalciferol  3,000 Units Oral Daily  . docusate sodium  200 mg Oral BID  . dorzolamide-timolol  1 drop Right Eye BID  . fenofibrate  160 mg Oral Daily  . insulin aspart  0-9 Units Subcutaneous Q6H  . lisinopril  20 mg Oral Daily  . Netarsudil-Latanoprost  1 drop Right Eye Daily  . PARoxetine  25 mg Oral q morning - 10a  . sodium chloride flush  10-40 mL Intracatheter Q12H    Assessment/Plan Hypertension COPD/Hx tobacco use x33 years BPH Chronic back pain Hx retinal detachment Moderate malnutrition  Acute perforated appendicitis with purulent peritonitis Laparoscopic partialcecetomy with appendectomy, 19 French Blake drain placement 02/18/2020 Dr. Beverely Pace #9 Post op  Ileus/fluid collections -IR does not want to drain currently  FEN: IV fluids/TPN/ice chips ID: Rocephin/Flagyl preop 2/18; Zosyn 2/18>>day10 DVT: Heparin stopped 2/25 - Possible IR drain - will place on Lovenox today Follow-up: Dr. Nadeen Landau Foley: Pain: Robaxin IV x3; no other pain medicine for the last 48 hours.  Plan: Continue to mobilize, IV antibiotics TPN await bowel function.  He will have labs repeated  Monday a.m.        LOS: 9 days    Steven Ellison 02/27/2020 Please see Amion

## 2020-02-27 NOTE — Progress Notes (Signed)
ANTICOAGULATION CONSULT NOTE - Initial Consult  Pharmacy Consult for enoxaparin Indication: VTE prophylaxis  No Known Allergies  Patient Measurements: Height: 5\' 10"  (177.8 cm) Weight: 235 lb (106.6 kg) IBW/kg (Calculated) : 73 HEPARIN DW (KG): 95.9   Vital Signs: Temp: 98.2 F (36.8 C) (02/27 0614) Temp Source: Oral (02/27 0614) BP: 150/77 (02/27 0614) Pulse Rate: 55 (02/27 0614)  Labs: Recent Labs    02/26/20 0405 02/27/20 0415  HGB 13.3  --   HCT 41.2  --   PLT 332  --   LABPROT 14.3  --   INR 1.1  --   CREATININE 1.04 1.07    Estimated Creatinine Clearance: 78.5 mL/min (by C-G formula based on SCr of 1.07 mg/dL).   Medical History: Past Medical History:  Diagnosis Date  . Back pain    spurs  . COPD (chronic obstructive pulmonary disease) ([place])    "mild per pt"  . Depression    takes Paxil daily  . Enlarged prostate   . Glaucoma    uses eye drops daily  . History of blood transfusion    as a child  . Hyperlipidemia    takes Fenofibrate daily  . Hypertension    takes Lisinopril daily  . Kidney stone   . Nocturia    takes [employer] daily  . Tuberculosis    as a child  . Weakness    numbness and tingling in left leg    Assessment: 69 year old male with acute perforated appendicitis with purulent peritonitis s/p appendectomy 2/18. Pharmacy consulted to start enoxaparin for VTE prophylaxis post op.  Patient has some serous fluid draining from JP drain but no overt bleeding noted, last CBC wnl, normal renal function, BMI 33  Goal of Therapy:  Monitor platelets by anticoagulation protocol: Yes   Plan:  Start enoxaparin 50mg  (0.5mg /kg) subcutaneously q24h Monitor CBC, signs/symptoms of bleeding   Brendolyn Patty, PharmD PGY2 Pharmacy Resident Phone [phone]  02/27/2020   9:20 AM

## 2020-02-28 LAB — BASIC METABOLIC PANEL
Anion gap: 9 (ref 5–15)
BUN: 22 mg/dL (ref 8–23)
CO2: 21 mmol/L — ABNORMAL LOW (ref 22–32)
Calcium: 8.9 mg/dL (ref 8.9–10.3)
Chloride: 111 mmol/L (ref 98–111)
Creatinine, Ser: 0.95 mg/dL (ref 0.61–1.24)
GFR calc Af Amer: >60 mL/min (ref >60)
GFR calc non Af Amer: >60 mL/min (ref >60)
Glucose, Bld: 141 mg/dL — ABNORMAL HIGH (ref 70–99)
Potassium: 4.3 mmol/L (ref 3.5–5.1)
Sodium: 141 mmol/L (ref 135–145)

## 2020-02-28 LAB — GLUCOSE, CAPILLARY
Glucose-Capillary: 135 mg/dL — ABNORMAL HIGH (ref 70–99)
Glucose-Capillary: 158 mg/dL — ABNORMAL HIGH (ref 70–99)
Glucose-Capillary: 159 mg/dL — ABNORMAL HIGH (ref 70–99)
Glucose-Capillary: 161 mg/dL — ABNORMAL HIGH (ref 70–99)

## 2020-02-28 MED ORDER — TRAVASOL 10 % IV SOLN
INTRAVENOUS | Status: AC
  Filled 2020-02-28: qty 1440

## 2020-02-28 NOTE — Progress Notes (Addendum)
PHARMACY - TOTAL PARENTERAL NUTRITION CONSULT NOTE   Indication: Prolonged ileus  Patient Measurements: Height: 5\' 10"  (177.8 cm) Weight: 235 lb (106.6 kg) IBW/kg (Calculated) : 73 TPN AdjBW (KG): 81.4 Body mass index is 33.72 kg/m.  Assessment: 69 year old male with a past medical history significant for HTN, HLD, glaucoma, kidney stones, "mild copd" whom presented to ED 02/17/19 with a one day history of lower quadrant abdominal pain that has steadily progressed. Now localized in RLQ. Surgery on 2/18 Acute perforated appendicitis with purulent peritonitis; Laparoscopic partialcecetomy with appendectomy, 19 French Blake drain placement. Pharmacy was consulted for TPN on POD #7 due to prolonged post op ileus.   Glucose / Insulin: no diabetes history; CBGs 140-150s on goal rate, stopping additional IVF. 7 units of sSSI required last 24hrs Electrolytes: K 4.3 (goal >4, currently receiving 132 mEq/day or 5.5 mEq/hr via TPN )>>will decrease in TPN some today with upward trend, CoCa 10.3. 2/27: Mag 2.1 (goal >2), Phos 3.7.  Renal: Scr 0.95 (stable), BUN within normal limits LFTs / TGs: LFTs within normal limits / TG up at 232.  Prealbumin / albumin: Prealbumin up 19.4 / albumin 2.3 Intake / Output; MIVF: 2762/1415 *UOP not all measured, NGT output down 1400 ml, JP drain output down to 15 mL. GI Imaging: None since TPN start Surgeries / Procedures: None since TPN start  Central access: PICC linw (to be placed 2/25) TPN start date: 2/25  Nutritional Goals (per Registered Dietician on 2/25): kCal: 2300-2500 , Protein: 125-150, Fluid: > 2.3 L  Goal TPN rate is 100 mL/hr (provides 144 g of protein and 2326 kcals per day)  Current Nutrition:  NPO/ice chips  Plan:  Continue TPN at goal rate of 100 mL/hr at 1800 Electrolytes in TPN: 31mEq/L of Na, 43mEq/L of K (120 mEq/day or 5 mEq/hr), 53mEq/L of Ca, 13mEq/L of Mg, and 52mmol/L of Phos. Cl:Ac ratio 1:2 Add standard MVI on MWF due to national  shortage and trace elements to TPN Continue Sensitive q6h SSI and adjust as needed -- will consider reduction to q8h if maintains controlled CBGs at full rate.  Stop additional IVF with TPN at goal rate.  Monitor TPN labs Mon/Thurs Monitor TG - next Monday Follow-up bowel function and ability to start enteral feeding    Sloan Leiter, PharmD, BCPS, BCCCP Clinical Pharmacist Clinical phone 02/28/2020 until 3PM [phone] Please refer to [facility] for [place] numbers 02/28/2020, 10:18 AM

## 2020-02-28 NOTE — Progress Notes (Signed)
10 Days Post-Op    CC: Abdominal pain  Subjective: Patient is walking the halls independently.  Still no flatus or BM.  He has a little bit of leakage when he voids sometimes from his rectum.  But nothing more.  Abdomen still distended but softer.  He has had less his NG yesterday. Still has some rales in his bases. Objective: Vital signs in last 24 hours: Temp:  [97.5 F (36.4 C)-98.5 F (36.9 C)] 98.3 F (36.8 C) (02/28 0446) Pulse Rate:  [52-70] 52 (02/28 0446) Resp:  [14-20] 20 (02/28 0446) BP: (122-154)/(73-74) 154/73 (02/28 0446) SpO2:  [96 %-97 %] 96 % (02/28 0446) Last BM Date: 02/25/20 2762 IV Voided x1 NG 1400 Drain 15 cc Afebrile vital signs are stable K+ 4.3  Intake/Output from previous day: 02/27 0701 - 02/28 0700 In: 2762.4 [I.V.:2452.5; IV Piggyback:309.9] Out: 1415 [Emesis/NG output:1400; Drains:15] Intake/Output this shift: No intake/output data recorded.  General appearance: alert, cooperative and no distress Resp: Few rales in his bases. Cardio: regular rate and rhythm, S1, S2 normal, no murmur, click, rub or gallop GI: Soft still somewhat distended.  Bowel sounds are still hypoactive.  No real flatus no BM.  Some liquid leaking from his rectum intermittently.  JP drain is serosanguineous Extremities: extremities normal, atraumatic, no cyanosis or edema  Lab Results:  Recent Labs    02/26/20 0405  WBC 10.2  HGB 13.3  HCT 41.2  PLT 332    BMET Recent Labs    02/27/20 0415 02/28/20 0349  NA 141 141  K 4.1 4.3  CL 109 111  CO2 23 21*  GLUCOSE 148* 141*  BUN 16 22  CREATININE 1.07 0.95  CALCIUM 8.8* 8.9   PT/INR Recent Labs    02/26/20 0405  LABPROT 14.3  INR 1.1    Recent Labs  Lab 02/24/20 0129  AST 18  ALT 16  ALKPHOS 56  BILITOT 1.3*  PROT 5.3*  ALBUMIN 2.3*     Lipase     Component Value Date/Time   LIPASE 26 02/18/2020 1408     Medications: . Chlorhexidine Gluconate Cloth  6 each Topical Daily  .  dorzolamide-timolol  1 drop Right Eye BID  . enoxaparin (LOVENOX) injection  50 mg Subcutaneous Q24H  . insulin aspart  0-9 Units Subcutaneous Q6H  . Netarsudil-Latanoprost  1 drop Right Eye Daily  . PARoxetine  25 mg Oral q morning - 10a  . sodium chloride flush  10-40 mL Intracatheter Q12H    Assessment/Plan Hypertension COPD/Hx tobacco use x33 years BPH Chronic back pain Hx retinal detachment Moderate malnutrition  Acute perforated appendicitis with purulent peritonitis Laparoscopic partialcecetomy with appendectomy, 19 French Blake drain placement 02/18/2020 Dr. Beverely Pace #9 Post op  Ileus/fluid collections -CT 02/25/2020 -IR does not want to drain currently  FEN: IV fluids/TPN/ice chips ID: Rocephin/Flagyl preop 2/18; Zosyn 2/18>>day11 DVT: Heparin stopped 2/25 - Possible IR drain - will place on Lovenox today Follow-up: Dr. Nadeen Landau Foley: Pain: Robaxin IV x3; no other pain medicine for the last 48 hours.  Plan: Continue IV antibiotics, continue NG tube, continue TPN.  He is walking the halls independently looks much better today than he has all week.  She does not open up he will need to repeat CT scan next week.       LOS: 10 days    Juelle Dickmann 02/28/2020 Please see Amion

## 2020-02-29 ENCOUNTER — Inpatient Hospital Stay (HOSPITAL_COMMUNITY): Payer: Medicare Other

## 2020-02-29 ENCOUNTER — Encounter (HOSPITAL_COMMUNITY): Payer: Self-pay

## 2020-02-29 LAB — CBC
HCT: 42.5 % (ref 39.0–52.0)
Hemoglobin: 13.7 g/dL (ref 13.0–17.0)
MCH: 31.7 pg (ref 26.0–34.0)
MCHC: 32.2 g/dL (ref 30.0–36.0)
MCV: 98.4 fL (ref 80.0–100.0)
Platelets: 314 10*3/uL (ref 150–400)
RBC: 4.32 MIL/uL (ref 4.22–5.81)
RDW: 13.7 % (ref 11.5–15.5)
WBC: 14.2 10*3/uL — ABNORMAL HIGH (ref 4.0–10.5)
nRBC: 0 % (ref 0.0–0.2)

## 2020-02-29 LAB — COMPREHENSIVE METABOLIC PANEL
ALT: 35 U/L (ref 0–44)
AST: 27 U/L (ref 15–41)
Albumin: 2.7 g/dL — ABNORMAL LOW (ref 3.5–5.0)
Alkaline Phosphatase: 80 U/L (ref 38–126)
Anion gap: 9 (ref 5–15)
BUN: 24 mg/dL — ABNORMAL HIGH (ref 8–23)
CO2: 23 mmol/L (ref 22–32)
Calcium: 9 mg/dL (ref 8.9–10.3)
Chloride: 108 mmol/L (ref 98–111)
Creatinine, Ser: 0.97 mg/dL (ref 0.61–1.24)
GFR calc Af Amer: >60 mL/min (ref >60)
GFR calc non Af Amer: >60 mL/min (ref >60)
Glucose, Bld: 128 mg/dL — ABNORMAL HIGH (ref 70–99)
Potassium: 4.3 mmol/L (ref 3.5–5.1)
Sodium: 140 mmol/L (ref 135–145)
Total Bilirubin: 0.8 mg/dL (ref 0.3–1.2)
Total Protein: 6 g/dL — ABNORMAL LOW (ref 6.5–8.1)

## 2020-02-29 LAB — GLUCOSE, CAPILLARY
Glucose-Capillary: 119 mg/dL — ABNORMAL HIGH (ref 70–99)
Glucose-Capillary: 121 mg/dL — ABNORMAL HIGH (ref 70–99)
Glucose-Capillary: 129 mg/dL — ABNORMAL HIGH (ref 70–99)
Glucose-Capillary: 135 mg/dL — ABNORMAL HIGH (ref 70–99)
Glucose-Capillary: 142 mg/dL — ABNORMAL HIGH (ref 70–99)
Glucose-Capillary: 145 mg/dL — ABNORMAL HIGH (ref 70–99)

## 2020-02-29 LAB — DIFFERENTIAL
Abs Immature Granulocytes: 0.11 10*3/uL — ABNORMAL HIGH (ref 0.00–0.07)
Basophils Absolute: 0.1 10*3/uL (ref 0.0–0.1)
Basophils Relative: 1 %
Eosinophils Absolute: 0.3 10*3/uL (ref 0.0–0.5)
Eosinophils Relative: 2 %
Immature Granulocytes: 1 %
Lymphocytes Relative: 11 %
Lymphs Abs: 1.6 10*3/uL (ref 0.7–4.0)
Monocytes Absolute: 0.9 10*3/uL (ref 0.1–1.0)
Monocytes Relative: 6 %
Neutro Abs: 11.2 10*3/uL — ABNORMAL HIGH (ref 1.7–7.7)
Neutrophils Relative %: 79 %

## 2020-02-29 LAB — MAGNESIUM: Magnesium: 2.2 mg/dL (ref 1.7–2.4)

## 2020-02-29 LAB — PREALBUMIN: Prealbumin: 28.4 mg/dL (ref 18–38)

## 2020-02-29 LAB — PHOSPHORUS: Phosphorus: 3.3 mg/dL (ref 2.5–4.6)

## 2020-02-29 LAB — TRIGLYCERIDES: Triglycerides: 169 mg/dL — ABNORMAL HIGH (ref <150)

## 2020-02-29 MED ORDER — INSULIN ASPART 100 UNIT/ML ~~LOC~~ SOLN
0.0000 [IU] | Freq: Three times a day (TID) | SUBCUTANEOUS | Status: DC
  Administered 2020-02-29 – 2020-03-01 (×5): 1 [IU] via SUBCUTANEOUS
  Administered 2020-03-02: 2 [IU] via SUBCUTANEOUS

## 2020-02-29 MED ORDER — TRAVASOL 10 % IV SOLN
INTRAVENOUS | Status: AC
  Filled 2020-02-29: qty 1440

## 2020-02-29 MED ORDER — IOHEXOL 300 MG/ML  SOLN
100.0000 mL | Freq: Once | INTRAMUSCULAR | Status: AC | PRN
  Administered 2020-02-29: 100 mL via INTRAVENOUS

## 2020-02-29 NOTE — Progress Notes (Signed)
[facility] Progress Note  11 Days Post-Op  Subjective: Patient reports bloating and discomfort from this today. Some nausea with trying to get contrast for CT scan down. Had some bowel function yesterday but not having any so far today.   Objective: Vital signs in last 24 hours: Temp:  [97.8 F (36.6 C)-98.6 F (37 C)] 97.8 F (36.6 C) (03/01 0411) Pulse Rate:  [55-70] 55 (03/01 0411) Resp:  [16-20] 20 (03/01 0411) BP: (143-147)/(72-79) 147/74 (03/01 0411) SpO2:  [84 %-98 %] 84 % (03/01 0411) Last BM Date: 02/25/20  Intake/Output from previous day: 02/28 0701 - 03/01 0700 In: 1611.3 [I.V.:1314.8; IV Piggyback:296.5] Out: [id number] [Emesis/NG output:1750; Drains:8] Intake/Output this shift: No intake/output data recorded.  PE: General: pleasant, NAD Heart: regular, rate, and rhythm.  Normal s1,s2. No obvious murmurs, gallops, or rubs noted.  Palpable radial and pedal pulses bilaterally Lungs: CTAB, no wheezes, rhonchi, or rales noted.  Respiratory effort nonlabored Abd: soft, NT, distended, BS hypoactive, drain in LLQ with serous fluid   Lab Results:  Recent Labs    02/29/20 0434  WBC 14.2*  HGB 13.7  HCT 42.5  PLT 314   BMET Recent Labs    02/28/20 0349 02/29/20 0434  NA 141 140  K 4.3 4.3  CL 111 108  CO2 21* 23  GLUCOSE 141* 128*  BUN 22 24*  CREATININE 0.95 0.97  CALCIUM 8.9 9.0   PT/INR No results for input(s): LABPROT, INR in the last 72 hours. CMP     Component Value Date/Time   NA 140 02/29/2020 0434   K 4.3 02/29/2020 0434   CL 108 02/29/2020 0434   CO2 23 02/29/2020 0434   GLUCOSE 128 (H) 02/29/2020 0434   BUN 24 (H) 02/29/2020 0434   CREATININE 0.97 02/29/2020 0434   CALCIUM 9.0 02/29/2020 0434   PROT 6.0 (L) 02/29/2020 0434   ALBUMIN 2.7 (L) 02/29/2020 0434   AST 27 02/29/2020 0434   ALT 35 02/29/2020 0434   ALKPHOS 80 02/29/2020 0434   BILITOT 0.8 02/29/2020 0434   GFRNONAA >60 02/29/2020 0434   GFRAA >60 02/29/2020  0434   Lipase     Component Value Date/Time   LIPASE 26 02/18/2020 1408       Studies/Results: No results found.  Anti-infectives: Anti-infectives (From admission, onward)   Start     Dose/Rate Route Frequency Ordered Stop   02/19/20 2300  piperacillin-tazobactam (ZOSYN) IVPB 3.375 g  Status:  Discontinued     3.375 g 12.5 mL/hr over 240 Minutes Intravenous Every 8 hours 02/18/20 2242 02/18/20 2243   02/18/20 2300  piperacillin-tazobactam (ZOSYN) IVPB 3.375 g     3.375 g 12.5 mL/hr over 240 Minutes Intravenous Every 8 hours 02/18/20 2243     02/18/20 1615  cefTRIAXone (ROCEPHIN) 2 g in sodium chloride 0.9 % 100 mL IVPB     2 g 200 mL/hr over 30 Minutes Intravenous  Once 02/18/20 1606 02/18/20 1825   02/18/20 1615  metroNIDAZOLE (FLAGYL) IVPB 500 mg     500 mg 100 mL/hr over 60 Minutes Intravenous  Once 02/18/20 1606 02/18/20 1817       Assessment/Plan Hypertension COPD/Hx tobacco use x33 years BPH Chronic back pain Hx retinal detachment Moderate malnutrition  Acute perforated appendicitis with purulent peritonitis Laparoscopic partialcecetomy with appendectomy, 19 French Blake drain placement 02/18/2020 Dr. Beverely Pace #11 Post opIleus/fluid collections  - CT 02/25/2020- IR felt these were not amenable to drainage at that time - WBC up slightly  today to 14, afebrile - patient had some bowel function yesterday but none today - repeat CT abdomen/pelvis today - continue TPN   FEN: TPN, NPO, IVF ID: Rocephin/Flagyl preop 2/18; Zosyn 2/18>> DVT: lovenox Follow-up: Dr. Nadeen Landau   Plan: CT today. Continue NGT and IV abx  LOS: 11 days    Brigid Re , [facility] Surgery 02/29/2020, 10:27 AM Please see Amion for pager number during day hours 7:00am-4:30pm

## 2020-02-29 NOTE — Plan of Care (Signed)
  Problem: Clinical Measurements: °Goal: Ability to maintain clinical measurements within normal limits will improve °Outcome: Progressing °Goal: Will remain free from infection °Outcome: Progressing °Goal: Respiratory complications will improve °Outcome: Progressing °Goal: Cardiovascular complication will be avoided °Outcome: Progressing °  °Problem: Elimination: °Goal: Will not experience complications related to bowel motility °Outcome: Progressing °  °Problem: Pain Managment: °Goal: General experience of comfort will improve °Outcome: Progressing °  °

## 2020-02-29 NOTE — Progress Notes (Signed)
PHARMACY - TOTAL PARENTERAL NUTRITION CONSULT NOTE   Indication: Prolonged ileus  Patient Measurements: Height: 5\' 10"  (177.8 cm) Weight: 235 lb (106.6 kg) IBW/kg (Calculated) : 73 TPN AdjBW (KG): 81.4 Body mass index is 33.72 kg/m.  Assessment: 69 year old male with a past medical history significant for HTN, HLD, glaucoma, kidney stones, "mild copd" whom presented to ED 02/17/19 with a one day history of lower quadrant abdominal pain that has steadily progressed. Now localized in RLQ. Surgery on 2/18 Acute perforated appendicitis with purulent peritonitis; Laparoscopic partialcecetomy with appendectomy, 19 French Blake drain placement. Pharmacy was consulted for TPN on POD #7 due to prolonged post op ileus.   Glucose / Insulin: no diabetes history; CBGs 120-160s on goal rate, stopping additional IVF. 5 units of sSSI required last 24hrs Electrolytes: K 4.3 (goal >4, currently receiving 132 mEq/day or 5.5 mEq/hr via TPN ), CoCa 10.3. 2/27: Mag 2.2 (goal >2), Phos 3.3.  Renal: Scr 0.95 (stable), BUN within normal limits LFTs / TGs: LFTs within normal limits / TG up at 232>169.  Prealbumin / albumin: Prealbumin up 19.4>28.4 / albumin 2.3>2.7 Intake / Output; MIVF: *UOP not all measured, NGT output down 1750 ml, JP drain output down to 8 mL; 2/28 stool x 1 GI Imaging: None since TPN start Surgeries / Procedures: None since TPN start  Central access: PICC linw (to be placed 2/25) TPN start date: 2/25  Nutritional Goals (per Registered Dietician on 2/25): kCal: 2300-2500 , Protein: 125-150, Fluid: > 2.3 L  Goal TPN rate is 100 mL/hr (provides 144 g of protein and 2326 kcals per day)  Current Nutrition:  NPO/ice chips  Plan:  Continue TPN at goal rate of 100 mL/hr at 1800 Electrolytes in TPN: 35mEq/L of Na, 1mEq/L of K (120 mEq/day or 5 mEq/hr), 73mEq/L of Ca, 58mEq/L of Mg, and 62mmol/L of Phos. Cl:Ac ratio 1:2 Add standard MVI on MWF due to national shortage and trace elements to  TPN Decrease to Sensitive q8h SSI and adjust as needed  Monitor TPN labs Mon/Thurs Follow-up bowel function and ability to start enteral feeding  Alanda Slim, PharmD, [facility] Clinical Pharmacist Please see AMION for all Pharmacists' Contact Phone Numbers 02/29/2020, 10:14 AM

## 2020-03-01 LAB — GLUCOSE, CAPILLARY
Glucose-Capillary: 140 mg/dL — ABNORMAL HIGH (ref 70–99)
Glucose-Capillary: 136 mg/dL — ABNORMAL HIGH (ref 70–99)
Glucose-Capillary: 134 mg/dL — ABNORMAL HIGH (ref 70–99)
Glucose-Capillary: 123 mg/dL — ABNORMAL HIGH (ref 70–99)
Glucose-Capillary: 144 mg/dL — ABNORMAL HIGH (ref 70–99)

## 2020-03-01 LAB — CBC
HCT: 45.9 % (ref 39.0–52.0)
Hemoglobin: 15.1 g/dL (ref 13.0–17.0)
MCH: 31.3 pg (ref 26.0–34.0)
MCHC: 32.9 g/dL (ref 30.0–36.0)
MCV: 95.2 fL (ref 80.0–100.0)
Platelets: 321 10*3/uL (ref 150–400)
RBC: 4.82 MIL/uL (ref 4.22–5.81)
RDW: 13.4 % (ref 11.5–15.5)
WBC: 14.5 10*3/uL — ABNORMAL HIGH (ref 4.0–10.5)
nRBC: 0 % (ref 0.0–0.2)

## 2020-03-01 LAB — BASIC METABOLIC PANEL
Anion gap: 12 (ref 5–15)
BUN: 29 mg/dL — ABNORMAL HIGH (ref 8–23)
CO2: 20 mmol/L — ABNORMAL LOW (ref 22–32)
Calcium: 9.1 mg/dL (ref 8.9–10.3)
Chloride: 104 mmol/L (ref 98–111)
Creatinine, Ser: 0.96 mg/dL (ref 0.61–1.24)
GFR calc Af Amer: >60 mL/min (ref >60)
GFR calc non Af Amer: >60 mL/min (ref >60)
Glucose, Bld: 146 mg/dL — ABNORMAL HIGH (ref 70–99)
Potassium: 4.3 mmol/L (ref 3.5–5.1)
Sodium: 136 mmol/L (ref 135–145)

## 2020-03-01 MED ORDER — BISACODYL 10 MG RE SUPP
10.0000 mg | Freq: Once | RECTAL | Status: AC
  Administered 2020-03-01: 10 mg via RECTAL
  Filled 2020-03-01: qty 1

## 2020-03-01 MED ORDER — TRAVASOL 10 % IV SOLN
INTRAVENOUS | Status: AC
  Filled 2020-03-01: qty 1440

## 2020-03-01 NOTE — Progress Notes (Signed)
PHARMACY - TOTAL PARENTERAL NUTRITION CONSULT NOTE   Indication: Prolonged ileus  Patient Measurements: Height: 5\' 10"  (177.8 cm) Weight: 235 lb (106.6 kg) IBW/kg (Calculated) : 73 TPN AdjBW (KG): 81.4 Body mass index is 33.72 kg/m.  Assessment: 69 year old male with a past medical history significant for HTN, HLD, glaucoma, kidney stones, "mild copd" whom presented to ED 02/17/19 with a one day history of lower quadrant abdominal pain that has steadily progressed. Now localized in RLQ. Surgery on 2/18 Acute perforated appendicitis with purulent peritonitis; Laparoscopic partialcecetomy with appendectomy, 19 French Blake drain placement. Pharmacy was consulted for TPN on POD #7 due to prolonged post op ileus.   Glucose / Insulin: no diabetes history; CBGs 120-140s on goal rate, stopping additional IVF. 3 units of sSSI required last 24hrs Electrolytes: K 4.3 (goal >4, currently receiving 132 mEq/day or 5.5 mEq/hr via TPN ), CoCa 10.3. 3/1: Mag 2.2 (goal >2), Phos 3.3.  Renal: Scr 0.96 (stable), BUN within normal limits LFTs / TGs: LFTs within normal limits / TG up at 232>169.  Prealbumin / albumin: Prealbumin up 19.4>28.4 / albumin 2.3>2.7 Intake / Output; MIVF: *UOP not all measured, NGT output down 1400 ml, JP drain output down to 15 mL; 2/28 stool x 1 GI Imaging: None since TPN start Surgeries / Procedures: None since TPN start  Central access: PICC linw (to be placed 2/25) TPN start date: 2/25  Nutritional Goals (per Registered Dietician on 2/25): kCal: 2300-2500 , Protein: 125-150, Fluid: > 2.3 L  Goal cyclic TPN rate is 80 mL/hr x 1, 160 ml/hr x 14, then 80 ml/hr (provides 144 g of protein and 2326 kcals per day)  Current Nutrition:  NPO/ice chips  Plan:  Will transition to cyclic TPN over 16 hours  Electrolytes in TPN: 13mEq/L of Na, 30mEq/L of K (120 mEq/day or 5 mEq/hr), 11mEq/L of Ca, 64mEq/L of Mg, and 80mmol/L of Phos. Cl:Ac ratio 1:2 Add standard MVI on MWF due to  national shortage and trace elements to TPN Decrease to Sensitive q8h SSI and adjust as needed  Monitor TPN labs Mon/Thurs Follow-up bowel function and ability to start enteral feeding   Alanda Slim, PharmD, [facility] Clinical Pharmacist Please see AMION for all Pharmacists' Contact Phone Numbers 03/01/2020, 8:19 AM

## 2020-03-01 NOTE — Progress Notes (Signed)
[facility] Progress Note  12 Days Post-Op  Subjective: Patient denies pain but abdominal distention unchanged. Some nausea occasionally. No bowel function. Walked 3x yesterday. Frustrated with lack of progress.    Objective: Vital signs in last 24 hours: Temp:  [97.6 F (36.4 C)-97.7 F (36.5 C)] 97.6 F (36.4 C) (03/02 0341) Pulse Rate:  [59-65] 59 (03/02 0341) Resp:  [16-18] 18 (03/02 0341) BP: (144-162)/(73-78) 144/73 (03/02 0341) SpO2:  [97 %-99 %] 97 % (03/02 0341) Last BM Date: 02/28/20  Intake/Output from previous day: 03/01 0701 - 03/02 0700 In: 1133.8 [I.V.:1083.8; IV Piggyback:50] Out: [id number] [Emesis/NG output:1400; Drains:15] Intake/Output this shift: No intake/output data recorded.  PE: General: sitting up, NAD Heart: regular, rate, and rhythm.  Normal s1,s2. No obvious murmurs, gallops, or rubs noted.  Palpable radial and pedal pulses bilaterally Lungs: CTAB, no wheezes, rhonchi, or rales noted.  Respiratory effort nonlabored Abd: soft, NT, distended, BS hypoactive, drain in LLQ with serous fluid   Lab Results:  Recent Labs    02/29/20 0434  WBC 14.2*  HGB 13.7  HCT 42.5  PLT 314   BMET Recent Labs    02/28/20 0349 02/29/20 0434  NA 141 140  K 4.3 4.3  CL 111 108  CO2 21* 23  GLUCOSE 141* 128*  BUN 22 24*  CREATININE 0.95 0.97  CALCIUM 8.9 9.0   PT/INR No results for input(s): LABPROT, INR in the last 72 hours. CMP     Component Value Date/Time   NA 140 02/29/2020 0434   K 4.3 02/29/2020 0434   CL 108 02/29/2020 0434   CO2 23 02/29/2020 0434   GLUCOSE 128 (H) 02/29/2020 0434   BUN 24 (H) 02/29/2020 0434   CREATININE 0.97 02/29/2020 0434   CALCIUM 9.0 02/29/2020 0434   PROT 6.0 (L) 02/29/2020 0434   ALBUMIN 2.7 (L) 02/29/2020 0434   AST 27 02/29/2020 0434   ALT 35 02/29/2020 0434   ALKPHOS 80 02/29/2020 0434   BILITOT 0.8 02/29/2020 0434   GFRNONAA >60 02/29/2020 0434   GFRAA >60 02/29/2020 0434   Lipase      Component Value Date/Time   LIPASE 26 02/18/2020 1408       Studies/Results: CT ABDOMEN PELVIS W CONTRAST  Result Date: 02/29/2020 CLINICAL DATA:  Follow-up perforated diverticulitis and small-bowel obstruction. EXAM: CT ABDOMEN AND PELVIS WITH CONTRAST TECHNIQUE: Multidetector CT imaging of the abdomen and pelvis was performed using the standard protocol following bolus administration of intravenous contrast. CONTRAST:  143mL OMNIPAQUE IOHEXOL 300 MG/ML  SOLN COMPARISON:  02/25/2020 FINDINGS: Lower Chest: Mild bibasilar atelectasis has decreased since previous study. Hepatobiliary: No hepatic masses identified. Several small hepatic cysts remains stable. Gallbladder is unremarkable. No evidence of biliary ductal dilatation. Pancreas:  No mass or inflammatory changes. Spleen: Within normal limits in size and appearance. Adrenals/Urinary Tract: No masses identified. A few tiny sub-cm renal cysts are again seen bilaterally. No evidence of ureteral calculi or hydronephrosis. Unremarkable unopacified urinary bladder. Stomach/Bowel: The nasogastric tube and surgical drain remain in place. The postop changes are again seen from a recent appendectomy. A small rim enhancing fluid collection in the right lower quadrant mesentery has decreased in size, currently measuring 2.4 x 1.2 cm, compared to 3.7 x 2.0 cm previously. In. Another rim enhancing fluid collection in is again seen in the right paracolic gutter, currently measuring 4.5 x 2.0 cm, compared to 6.2 x 2.9 cm previously. No new or enlarging fluid collections are seen. Moderate dilatation of proximal  small bowel is similar to previous study. A transition point is seen at the site of the small fluid collection in the right lower quadrant mesentery, suspicious for adhesion. Diverticulosis is seen mainly involving the descending and sigmoid colon, however there is no evidence of diverticulitis. Vascular/Lymphatic: No pathologically enlarged lymph nodes. No  abdominal aortic aneurysm. Aortic atherosclerosis incidentally noted. Reproductive:  No mass or other significant abnormality. Other:  None. Musculoskeletal:  No suspicious bone lesions identified. IMPRESSION: 1. Decreased size of small abscesses in the right lower quadrant mesentery and right paracolic gutter since prior study. 2. No significant change in distal small bowel obstruction, with transition point at the site of the small abscess in the right lower quadrant mesentery, suspicious for adhesion. 3. Colonic diverticulosis. No radiographic evidence of diverticulitis. Electronically Signed   By: Marlaine Hind M.D.   On: 02/29/2020 12:56    Anti-infectives: Anti-infectives (From admission, onward)   Start     Dose/Rate Route Frequency Ordered Stop   02/19/20 2300  piperacillin-tazobactam (ZOSYN) IVPB 3.375 g  Status:  Discontinued     3.375 g 12.5 mL/hr over 240 Minutes Intravenous Every 8 hours 02/18/20 2242 02/18/20 2243   02/18/20 2300  piperacillin-tazobactam (ZOSYN) IVPB 3.375 g     3.375 g 12.5 mL/hr over 240 Minutes Intravenous Every 8 hours 02/18/20 2243     02/18/20 1615  cefTRIAXone (ROCEPHIN) 2 g in sodium chloride 0.9 % 100 mL IVPB     2 g 200 mL/hr over 30 Minutes Intravenous  Once 02/18/20 1606 02/18/20 1825   02/18/20 1615  metroNIDAZOLE (FLAGYL) IVPB 500 mg     500 mg 100 mL/hr over 60 Minutes Intravenous  Once 02/18/20 1606 02/18/20 1817       Assessment/Plan Hypertension COPD/Hx tobacco use x33 years BPH Chronic back pain Hx retinal detachment Moderate malnutrition  Acute perforated appendicitis with purulent peritonitis Laparoscopic partialcecetomy with appendectomy, 19 French Blake drain placement 02/18/2020 Dr. Beverely Pace #12 Post opIleus/fluid collections - CT 2/25- IR felt these were not amenable to drainage at that time - CT 3/1 - some improvement in RLQ mesenteric and paracolic gutter collections, sbo with transition at RLQ mesentery -  WBC 14 yesterday, repeat labs today, afebrile - continue TPN - prealbumin 28.4 yesterday - continue to try to clamp NGT, try suppository today  FEN: TPN, clamping trials ID: Rocephin/Flagyl preop 2/18; Zosyn 2/18>> DVT: lovenox Follow-up: Dr. Nadeen Landau   Plan: Continue TPN and mobilization. Await return in bowel function. Patient may end up needing to go home on TPN but regardless of this will need to be having bowel function prior to going home.   LOS: 12 days    Brigid Re , [facility] Surgery 03/01/2020, 7:57 AM Please see Amion for pager number during day hours 7:00am-4:30pm

## 2020-03-01 NOTE — Progress Notes (Signed)
Nutrition Follow-up  RD working remotely.  DOCUMENTATION CODES:   Obesity unspecified  INTERVENTION:   -TPN management per pharmacy -RD will follow for diet advancement and supplement as appropriate  NUTRITION DIAGNOSIS:   Increased nutrient needs related to post-op healing as evidenced by estimated needs.  Ongoing  GOAL:   Patient will meet greater than or equal to 90% of their needs  Met with TPN  MONITOR:   Diet advancement, TF tolerance, Labs, Skin, I & O's  REASON FOR ASSESSMENT:   Consult New TPN/TNA  ASSESSMENT:   Steven Ellison is a very pleasant 58yoM with hx of HTN, HLD, glaucoma, kidney stones, "mild copd" whom presented to ED today with 1d hx of lower quadrant abdominal pain that has steadily progressed. Now localized in RLQ. Denies ever having had this kind of pain before. Pain does not radiate. Nothing makes it better/worse. Associated with chills. Denies n/v. Denies blood in stool. Denies weight loss.  2/18- s/pPROCEDURE:Laparoscopic partial cecectomy (with appendectomy) 2/20- NGT placed 2/25- PICC placed, TPN initiated, CT scan revealed revealed fluid collections on rt pelvis and right peritoneum/paracolic (small and not approachable per IR) 3/1- CT of abdomen and pelvis reveals improving RLQ abscess but with adjacent SBO secondary to early adhesive diease  Reviewed I/O's: -281 ml x 24 hours and -4.5 L since admission  NGT output: 1.4 L x 24 hours  Drain output: 15 ml x 24 hours  Pt remains on TPN, receiving at 100 ml/hr, which provides 2326 kcals and 144 grams of protein, meeting 100% of estimated kcal and protein needs. Per pharmacy notes, plan to transition to cyclic TPN over 16 hours today @ 1800 (to provide 2326 kcals and 144 grams protein daily), meeting 100% of estimated kcal and protein needs.   Per MD notes, plan NGT clamping trials today. Pt may require home TPN at discharge.   Labs reviewed: CBGS: 129-140 (inpatient orders for glycemic  control are 0-9 units insulin aspart every 8 hours).   Diet Order:   Diet Order            Diet NPO time specified Except for: Ice Chips, Sips with Meds, Other (See Comments)  Diet effective midnight              EDUCATION NEEDS:   Education needs have been addressed  Skin:  Skin Assessment: Skin Integrity Issues: Skin Integrity Issues:: Incisions Incisions: closed abdomen  Last BM:  02/28/20  Height:   Ht Readings from Last 1 Encounters:  02/18/20 _0  (1.778 m)    Weight:   Wt Readings from Last 1 Encounters:  02/18/20 106.6 kg    Ideal Body Weight:  75.5 kg  BMI:  Body mass index is 33.72 kg/m.  Estimated Nutritional Needs:   Kcal:  9458-5929  Protein:  125-150 grams  Fluid:  > 2.3 L    Steven Ellison, RD, LDN, [place] Registered Dietitian II Certified Diabetes Care and Education Specialist Please refer to [facility] for RD and/or RD on-call/weekend/after hours pager

## 2020-03-02 LAB — COMPREHENSIVE METABOLIC PANEL
ALT: 35 U/L (ref 0–44)
AST: 21 U/L (ref 15–41)
Albumin: 3 g/dL — ABNORMAL LOW (ref 3.5–5.0)
Alkaline Phosphatase: 96 U/L (ref 38–126)
Anion gap: 10 (ref 5–15)
BUN: 32 mg/dL — ABNORMAL HIGH (ref 8–23)
CO2: 23 mmol/L (ref 22–32)
Calcium: 9.2 mg/dL (ref 8.9–10.3)
Chloride: 102 mmol/L (ref 98–111)
Creatinine, Ser: 0.93 mg/dL (ref 0.61–1.24)
GFR calc Af Amer: >60 mL/min (ref >60)
GFR calc non Af Amer: >60 mL/min (ref >60)
Glucose, Bld: 123 mg/dL — ABNORMAL HIGH (ref 70–99)
Potassium: 4.5 mmol/L (ref 3.5–5.1)
Sodium: 135 mmol/L (ref 135–145)
Total Bilirubin: 0.5 mg/dL (ref 0.3–1.2)
Total Protein: 6.3 g/dL — ABNORMAL LOW (ref 6.5–8.1)

## 2020-03-02 LAB — CBC
HCT: 44 % (ref 39.0–52.0)
Hemoglobin: 14.5 g/dL (ref 13.0–17.0)
MCH: 32.3 pg (ref 26.0–34.0)
MCHC: 33 g/dL (ref 30.0–36.0)
MCV: 98 fL (ref 80.0–100.0)
Platelets: 289 10*3/uL (ref 150–400)
RBC: 4.49 MIL/uL (ref 4.22–5.81)
RDW: 13.2 % (ref 11.5–15.5)
WBC: 15.7 10*3/uL — ABNORMAL HIGH (ref 4.0–10.5)
nRBC: 0 % (ref 0.0–0.2)

## 2020-03-02 LAB — GLUCOSE, CAPILLARY
Glucose-Capillary: 115 mg/dL — ABNORMAL HIGH (ref 70–99)
Glucose-Capillary: 139 mg/dL — ABNORMAL HIGH (ref 70–99)
Glucose-Capillary: 165 mg/dL — ABNORMAL HIGH (ref 70–99)
Glucose-Capillary: 95 mg/dL (ref 70–99)

## 2020-03-02 MED ORDER — DOCUSATE SODIUM 100 MG PO CAPS
100.0000 mg | ORAL_CAPSULE | Freq: Two times a day (BID) | ORAL | Status: DC
  Administered 2020-03-02 – 2020-03-04 (×5): 100 mg via ORAL
  Filled 2020-03-02 (×7): qty 1

## 2020-03-02 MED ORDER — TRAVASOL 10 % IV SOLN
INTRAVENOUS | Status: AC
  Filled 2020-03-02: qty 1440

## 2020-03-02 MED ORDER — BOOST / RESOURCE BREEZE PO LIQD CUSTOM
1.0000 | Freq: Two times a day (BID) | ORAL | Status: DC
  Administered 2020-03-02 – 2020-03-04 (×2): 1 via ORAL

## 2020-03-02 MED ORDER — INSULIN ASPART 100 UNIT/ML ~~LOC~~ SOLN
0.0000 [IU] | Freq: Four times a day (QID) | SUBCUTANEOUS | Status: DC
  Administered 2020-03-03 (×2): 1 [IU] via SUBCUTANEOUS
  Administered 2020-03-04: 2 [IU] via SUBCUTANEOUS

## 2020-03-02 MED ORDER — BISACODYL 10 MG RE SUPP: 10.0000 mg | Freq: Every day | RECTAL | Status: DC | PRN

## 2020-03-02 MED ORDER — POLYETHYLENE GLYCOL 3350 17 G PO PACK
17.0000 g | PACK | Freq: Every day | ORAL | Status: DC
  Administered 2020-03-02: 17 g via ORAL
  Filled 2020-03-02: qty 1

## 2020-03-02 NOTE — Progress Notes (Signed)
PHARMACY - TOTAL PARENTERAL NUTRITION CONSULT NOTE   Indication: Prolonged ileus  Patient Measurements: Height: 5\' 10"  (177.8 cm) Weight: 235 lb (106.6 kg) IBW/kg (Calculated) : 73 TPN AdjBW (KG): 81.4 Body mass index is 33.72 kg/m.  Assessment: 69 year old male with a past medical history significant for HTN, HLD, glaucoma, kidney stones, "mild copd" whom presented to ED 02/17/19 with a one day history of lower quadrant abdominal pain that has steadily progressed. Now localized in RLQ. Surgery on 2/18 Acute perforated appendicitis with purulent peritonitis; Laparoscopic partialcecetomy with appendectomy, 19 French Blake drain placement. Pharmacy was consulted for TPN on POD #7 due to prolonged post op ileus.   Glucose / Insulin: no diabetes history; CBGs 120-160s on goal rate, stopping additional IVF. 3 units of sSSI required last 24hrs Electrolytes: K 4.5 (goal >4, trending up with 120 mEq K in TPN), CoCa 10.2. 3/1: Mag 2.2 (goal >2), Phos 3.3.  Renal: Scr 0.93 (stable), BUN rising at 32 LFTs / TGs: LFTs within normal limits / TG up at 232>169.  Prealbumin / albumin: Prealbumin up 19.4>28.4 / albumin 2.3>2.7 Intake / Output; MIVF: *UOP not all measured, NGT output down 700 ml (clamped for 9 hrs yesterday and tolerated), JP drain output down to 15 mL; 3/2 stool x 1 (small) GI Imaging:  3/1 CT abdomen/pelvic: improving RLQ abscess but adjacent SBO secondary to early adhesive disease Surgeries / Procedures:  3/2 - NGT clamped x9 hrs  Central access: double lumen PICC placed 2/25 TPN start date: 2/25  Nutritional Goals (per Registered Dietician on 3/2): kCal: 2300-2500 , Protein: 125-150, Fluid: > 2.3 L  Goal cyclic TPN rate is 80 mL/hr x 1, 160 ml/hr x 14, then 80 ml/hr (provides 144 g of protein and 2326 kcals per day)  Current Nutrition:  CLD 3/3   Plan:  Cycle TPN 1920 mL over 12 hours: Infuse TPN at rate of 109 mL/hr for 1 hour then increase to 218 mL/hr for 10 hours, then  down to 109 mL/hr for 1 hour.   Electrolytes in TPN: 23mEq/L of Na, reduce to 45 mEq/L of K (108 mEq/day,~9.8 mEq/mL at rate of 218 mL/hr), 68mEq/L of Ca, 102mEq/L of Mg, and 55mmol/L of Phos. Cl:Ac ratio 1:2 Add standard MVI on MWF due to national shortage and trace elements to TPN Continue Sensitive q6h SSI and adjust as needed  Monitor TPN labs Mon/Thurs Follow-up enteral intake and ability to advance diet   Sloan Leiter, PharmD, BCPS, BCCCP Clinical Pharmacist Clinical phone 03/02/2020 until 3PM [phone] Please refer to [facility] for [place] numbers 03/02/2020, 9:55 AM

## 2020-03-02 NOTE — Progress Notes (Signed)
Nutrition Follow-up  DOCUMENTATION CODES:   Obesity unspecified  INTERVENTION:   -TPN management per pharmacy -Boost Breeze po BID, each supplement provides 250 kcal and 9 grams of protein -RD will follow for diet advancement and supplement as appropriate  NUTRITION DIAGNOSIS:   Increased nutrient needs related to post-op healing as evidenced by estimated needs.  Ongoing  GOAL:   Patient will meet greater than or equal to 90% of their needs  Met with TPN  MONITOR:   PO intake, Supplement acceptance, Diet advancement, Labs, Weight trends, Skin, I & O's  REASON FOR ASSESSMENT:   Consult New TPN/TNA  ASSESSMENT:   Steven Ellison is a very pleasant 19yoM with hx of HTN, HLD, glaucoma, kidney stones, "mild copd" whom presented to ED today with 1d hx of lower quadrant abdominal pain that has steadily progressed. Now localized in RLQ. Denies ever having had this kind of pain before. Pain does not radiate. Nothing makes it better/worse. Associated with chills. Denies n/v. Denies blood in stool. Denies weight loss.  2/18- s/pPROCEDURE:Laparoscopic partial cecectomy (with appendectomy) 2/20- NGT placed 2/25- PICC placed, TPN initiated, CT scan revealed revealed fluid collections on rt pelvis andright peritoneum/paracolic (small and not approachable per IR) 3/1- CT of abdomen and pelvis reveals improving RLQ abscess but with adjacent SBO secondary to early adhesive diease  Reviewed I/O's: -816 ml x 24 hours and -5.4 L since admission  UOP: 200 ml x 24 hours  NGT output: 700 ml x 24 hours  Drain output: 15 ml x 24 hours   Pt tolerated NGT clamping trials yesterday. Plan continues NGT clamping and advance to clear liquids; NGT may be d/c later today or tomorrow per surgery.   Pt transitioned to cyclic TPN on 01/31/21; pt receiving TPN over 16 hours (80 mL/hr x 1, 160 ml/hr x 14, then 80 ml/hr:, which provides 2326 kcals and 144 grams protein, meeting 100% of estimated kcal and  protein needs.   Labs reviewed: CBGS: 123-165 (inpatient orders for glycemic control are 0-9 units insulin aspart every 6 hours).   Diet Order:   Diet Order            Diet clear liquid Room service appropriate? Yes; Fluid consistency: Thin  Diet effective now              EDUCATION NEEDS:   Education needs have been addressed  Skin:  Skin Assessment: Skin Integrity Issues: Skin Integrity Issues:: Incisions Incisions: closed abdomen  Last BM:  02/28/20  Height:   Ht Readings from Last 1 Encounters:  02/18/20 5' 10"  (1.778 m)    Weight:   Wt Readings from Last 1 Encounters:  02/18/20 106.6 kg    Ideal Body Weight:  75.5 kg  BMI:  Body mass index is 33.72 kg/m.  Estimated Nutritional Needs:   Kcal:  4825-0037  Protein:  125-150 grams  Fluid:  > 2.3 L    Loistine Chance, RD, LDN, [employer] Registered Dietitian II Certified Diabetes Care and Education Specialist Please refer to [facility] for RD and/or RD on-call/weekend/after hours pager

## 2020-03-02 NOTE — Progress Notes (Signed)
[facility] Progress Note  13 Days Post-Op  Subjective: Patient reports small formed BM after suppository yesterday and feels like he is having increased flatus. Walked 15 laps yesterday. Tolerated NGT being clamped for 9 hrs yesterday.   Objective: Vital signs in last 24 hours: Temp:  [97.6 F (36.4 C)-98 F (36.7 C)] 97.9 F (36.6 C) (03/03 0435) Pulse Rate:  [62-68] 68 (03/03 0435) Resp:  [18-20] 20 (03/03 0435) BP: (140-181)/(79-88) 166/79 (03/03 0435) SpO2:  [91 %-98 %] 96 % (03/03 0435) Last BM Date: 02/28/20  Intake/Output from previous day: 03/02 0701 - 03/03 0700 In: 100 [P.O.:100] Out: 916 [Urine:200; Emesis/NG output:700; Drains:15; Stool:1] Intake/Output this shift: No intake/output data recorded.  PE: General: sitting up, NAD Heart: regular, rate, and rhythm. Normal s1,s2. No obvious murmurs, gallops, or rubs noted. Palpable radial and pedal pulses bilaterally Lungs: CTAB, no wheezes, rhonchi, or rales noted. Respiratory effort nonlabored Abd: soft, NT,distended,BS hypoactive, drain in LLQ with serous fluid   Lab Results:  Recent Labs    03/01/20 0829 03/02/20 0404  WBC 14.5* 15.7*  HGB 15.1 14.5  HCT 45.9 44.0  PLT 321 289   BMET Recent Labs    03/01/20 0829 03/02/20 0404  NA 136 135  K 4.3 4.5  CL 104 102  CO2 20* 23  GLUCOSE 146* 123*  BUN 29* 32*  CREATININE 0.96 0.93  CALCIUM 9.1 9.2   PT/INR No results for input(s): LABPROT, INR in the last 72 hours. CMP     Component Value Date/Time   NA 135 03/02/2020 0404   K 4.5 03/02/2020 0404   CL 102 03/02/2020 0404   CO2 23 03/02/2020 0404   GLUCOSE 123 (H) 03/02/2020 0404   BUN 32 (H) 03/02/2020 0404   CREATININE 0.93 03/02/2020 0404   CALCIUM 9.2 03/02/2020 0404   PROT 6.3 (L) 03/02/2020 0404   ALBUMIN 3.0 (L) 03/02/2020 0404   AST 21 03/02/2020 0404   ALT 35 03/02/2020 0404   ALKPHOS 96 03/02/2020 0404   BILITOT 0.5 03/02/2020 0404   GFRNONAA >60 03/02/2020  0404   GFRAA >60 03/02/2020 0404   Lipase     Component Value Date/Time   LIPASE 26 02/18/2020 1408       Studies/Results: CT ABDOMEN PELVIS W CONTRAST  Result Date: 02/29/2020 CLINICAL DATA:  Follow-up perforated diverticulitis and small-bowel obstruction. EXAM: CT ABDOMEN AND PELVIS WITH CONTRAST TECHNIQUE: Multidetector CT imaging of the abdomen and pelvis was performed using the standard protocol following bolus administration of intravenous contrast. CONTRAST:  133mL OMNIPAQUE IOHEXOL 300 MG/ML  SOLN COMPARISON:  02/25/2020 FINDINGS: Lower Chest: Mild bibasilar atelectasis has decreased since previous study. Hepatobiliary: No hepatic masses identified. Several small hepatic cysts remains stable. Gallbladder is unremarkable. No evidence of biliary ductal dilatation. Pancreas:  No mass or inflammatory changes. Spleen: Within normal limits in size and appearance. Adrenals/Urinary Tract: No masses identified. A few tiny sub-cm renal cysts are again seen bilaterally. No evidence of ureteral calculi or hydronephrosis. Unremarkable unopacified urinary bladder. Stomach/Bowel: The nasogastric tube and surgical drain remain in place. The postop changes are again seen from a recent appendectomy. A small rim enhancing fluid collection in the right lower quadrant mesentery has decreased in size, currently measuring 2.4 x 1.2 cm, compared to 3.7 x 2.0 cm previously. In. Another rim enhancing fluid collection in is again seen in the right paracolic gutter, currently measuring 4.5 x 2.0 cm, compared to 6.2 x 2.9 cm previously. No new or enlarging fluid  collections are seen. Moderate dilatation of proximal small bowel is similar to previous study. A transition point is seen at the site of the small fluid collection in the right lower quadrant mesentery, suspicious for adhesion. Diverticulosis is seen mainly involving the descending and sigmoid colon, however there is no evidence of diverticulitis.  Vascular/Lymphatic: No pathologically enlarged lymph nodes. No abdominal aortic aneurysm. Aortic atherosclerosis incidentally noted. Reproductive:  No mass or other significant abnormality. Other:  None. Musculoskeletal:  No suspicious bone lesions identified. IMPRESSION: 1. Decreased size of small abscesses in the right lower quadrant mesentery and right paracolic gutter since prior study. 2. No significant change in distal small bowel obstruction, with transition point at the site of the small abscess in the right lower quadrant mesentery, suspicious for adhesion. 3. Colonic diverticulosis. No radiographic evidence of diverticulitis. Electronically Signed   By: Marlaine Hind M.D.   On: 02/29/2020 12:56    Anti-infectives: Anti-infectives (From admission, onward)   Start     Dose/Rate Route Frequency Ordered Stop   02/19/20 2300  piperacillin-tazobactam (ZOSYN) IVPB 3.375 g  Status:  Discontinued     3.375 g 12.5 mL/hr over 240 Minutes Intravenous Every 8 hours 02/18/20 2242 02/18/20 2243   02/18/20 2300  piperacillin-tazobactam (ZOSYN) IVPB 3.375 g     3.375 g 12.5 mL/hr over 240 Minutes Intravenous Every 8 hours 02/18/20 2243     02/18/20 1615  cefTRIAXone (ROCEPHIN) 2 g in sodium chloride 0.9 % 100 mL IVPB     2 g 200 mL/hr over 30 Minutes Intravenous  Once 02/18/20 1606 02/18/20 1825   02/18/20 1615  metroNIDAZOLE (FLAGYL) IVPB 500 mg     500 mg 100 mL/hr over 60 Minutes Intravenous  Once 02/18/20 1606 02/18/20 1817       Assessment/Plan Hypertension COPD/Hx tobacco use x33 years BPH Chronic back pain Hx retinal detachment Moderate malnutrition  Acute perforated appendicitis with purulent peritonitis Laparoscopic partialcecetomy with appendectomy, 19 French Blake drain placement 02/18/2020 Dr. Beverely Pace #13 Post opIleus/fluid collections - CT 2/25- IRfelt these were not amenable to drainage at that time - CT 3/1 - some improvement in RLQ mesenteric and  paracolic gutter collections, sbo with transition at RLQ mesentery - WBC 15.7, afebrile, drain output serous - stop abx today - continue TPN- prealbumin 28.4 (3/1) - patient with a small BM yesterday and increased flatus - clamp NGT and allow CLD today, if tolerating will d/c NGT later today vs tomorrow. If patient becomes nauseated or distended, reconnect to LIWS  FEN:TPN, clamp NGT, CLD ID: Rocephin/Flagyl preop 2/18; Zosyn 2/18>3/3 [id number] Follow-up: Dr. Nadeen Landau   Plan: Continue TPN and mobilization. Clamp NGT and trial CLD. Stop abx and monitor WBC  LOS: 13 days    Brigid Re , [facility] 03/02/2020, 9:20 AM Please see Amion for pager number during day hours 7:00am-4:30pm

## 2020-03-03 LAB — COMPREHENSIVE METABOLIC PANEL
ALT: 31 U/L (ref 0–44)
AST: 18 U/L (ref 15–41)
Albumin: 2.7 g/dL — ABNORMAL LOW (ref 3.5–5.0)
Alkaline Phosphatase: 88 U/L (ref 38–126)
Anion gap: 8 (ref 5–15)
BUN: 34 mg/dL — ABNORMAL HIGH (ref 8–23)
CO2: 22 mmol/L (ref 22–32)
Calcium: 8.6 mg/dL — ABNORMAL LOW (ref 8.9–10.3)
Chloride: 104 mmol/L (ref 98–111)
Creatinine, Ser: 0.93 mg/dL (ref 0.61–1.24)
GFR calc Af Amer: >60 mL/min (ref >60)
GFR calc non Af Amer: >60 mL/min (ref >60)
Glucose, Bld: 147 mg/dL — ABNORMAL HIGH (ref 70–99)
Potassium: 4.5 mmol/L (ref 3.5–5.1)
Sodium: 134 mmol/L — ABNORMAL LOW (ref 135–145)
Total Bilirubin: 0.7 mg/dL (ref 0.3–1.2)
Total Protein: 5.6 g/dL — ABNORMAL LOW (ref 6.5–8.1)

## 2020-03-03 LAB — GLUCOSE, CAPILLARY
Glucose-Capillary: 106 mg/dL — ABNORMAL HIGH (ref 70–99)
Glucose-Capillary: 107 mg/dL — ABNORMAL HIGH (ref 70–99)
Glucose-Capillary: 135 mg/dL — ABNORMAL HIGH (ref 70–99)
Glucose-Capillary: 157 mg/dL — ABNORMAL HIGH (ref 70–99)

## 2020-03-03 LAB — PHOSPHORUS: Phosphorus: 2.8 mg/dL (ref 2.5–4.6)

## 2020-03-03 LAB — MAGNESIUM: Magnesium: 2.1 mg/dL (ref 1.7–2.4)

## 2020-03-03 MED ORDER — TRAVASOL 10 % IV SOLN
INTRAVENOUS | Status: DC
  Filled 2020-03-03: qty 1440

## 2020-03-03 MED ORDER — POLYETHYLENE GLYCOL 3350 17 G PO PACK
17.0000 g | PACK | Freq: Every day | ORAL | Status: DC
  Administered 2020-03-03 – 2020-03-04 (×2): 17 g via ORAL
  Filled 2020-03-03 (×4): qty 1

## 2020-03-03 NOTE — Plan of Care (Signed)
  Problem: Education: Goal: Knowledge of General Education information will improve Description Including pain rating scale, medication(s)/side effects and non-pharmacologic comfort measures Outcome: Progressing   

## 2020-03-03 NOTE — Progress Notes (Addendum)
PHARMACY - TOTAL PARENTERAL NUTRITION CONSULT NOTE   Indication: Prolonged ileus  Patient Measurements: Height: 5\' 10"  (177.8 cm) Weight: 235 lb (106.6 kg) IBW/kg (Calculated) : 73 TPN AdjBW (KG): 81.4 Body mass index is 33.72 kg/m.  Assessment: 69 year old male with a past medical history significant for HTN, HLD, glaucoma, kidney stones, "mild copd" whom presented to ED 02/17/19 with a one day history of lower quadrant abdominal pain that has steadily progressed. Now localized in RLQ. Surgery on 2/18 Acute perforated appendicitis with purulent peritonitis; Laparoscopic partialcecetomy with appendectomy, 19 French Blake drain placement. Pharmacy was consulted for TPN on POD #7 due to prolonged post op ileus.   Glucose / Insulin: no diabetes history; CBGs 120-160s on TPN, low of 95 off TPN. 1 units of sSSI required last 24hrs. Electrolytes: Na down 134, K 4.5 (goal >4, 108 mEq/day in TPN), CoCa 10.2. 3/1: Mag 2.1 (goal >2), Phos 2.8   Renal: Scr 0.93 (stable), BUN rising at 32 LFTs / TGs: LFTs within normal limits / TG up at 232>169.  Prealbumin / albumin: Prealbumin up 19.4>28.4 / albumin 2.3>2.7 Intake / Output; MIVF: *UOP not all measured, NGT removed 3/4, JP drain removed 3/3; 3/3 stool x 2  GI Imaging:  3/1 CT abdomen/pelvic: improving RLQ abscess but adjacent SBO secondary to early adhesive disease Surgeries / Procedures:  3/2 - NGT clamped x9 hrs 3/3 - JP drain removed 3/4 - NGT removed  Central access: double lumen PICC placed 2/25 TPN start date: 2/25  Nutritional Goals (per Registered Dietician on 3/3): kCal: 2300-2500 , Protein: 125-150, Fluid: > 2.3 L   Current Nutrition:  CLD 3/3  Plan to advanced to FLD 3/5 and wean TPN to 1/2.   Plan:  Cycle TPN 1920 mL over 12 hours: Infuse TPN at rate of 109 mL/hr for 1 hour then increase to 218 mL/hr for 10 hours, then down to 109 mL/hr for 1 hour.   Electrolytes in TPN: 68mEq/L of Na, reduce to 40 mEq/L of K ( 96 mEq/day),  36mEq/L of Ca, 17mEq/L of Mg, and 62mmol/L of Phos. Cl:Ac ratio 1:2 Add standard MVI on MWF due to national shortage and trace elements to TPN Continue Sensitive q6h SSI and adjust as needed  Monitor TPN labs Mon/Thurs  Follow-up enteral intake and ability to advance diet -- may wean TPN to 1/2 in AM   Sloan Leiter, PharmD, BCPS, BCCCP Clinical Pharmacist Clinical phone 03/03/2020 until 3PM - [phone] Please refer to [facility] for [place] numbers 03/03/2020, 8:46 AM

## 2020-03-03 NOTE — Progress Notes (Signed)
[facility] Progress Note  14 Days Post-Op  Subjective: Patient reports he tolerated NGT being clamped all night. Some mild nausea but no emesis or reconnection to LIWS. +flatus, no further BM yet. Taking in mostly ice chips.   Objective: Vital signs in last 24 hours: Temp:  [97.7 F (36.5 C)-98.2 F (36.8 C)] 97.7 F (36.5 C) (03/04 0618) Pulse Rate:  [59-60] 59 (03/04 0618) Resp:  [16-18] 18 (03/04 0618) BP: (94-160)/(74-88) 155/74 (03/04 0618) SpO2:  [97 %] 97 % (03/04 0618) Last BM Date: 03/02/20  Intake/Output from previous day: 03/03 0701 - 03/04 0700 In: 2637.1 [P.O.:240; I.V.:1707.1; IV Piggyback:690] Out: 100 [Emesis/NG output:100] Intake/Output this shift: No intake/output data recorded.  PE: General:sitting up,NAD Heart: regular, rate, and rhythm. Normal s1,s2. No obvious murmurs, gallops, or rubs noted. Palpable radial and pedal pulses bilaterally Lungs: CTAB, no wheezes, rhonchi, or rales noted. Respiratory effort nonlabored Abd: soft, NT,distended,+BS, dressing to previous drain site c/d/i   Lab Results:  Recent Labs    03/01/20 0829 03/02/20 0404  WBC 14.5* 15.7*  HGB 15.1 14.5  HCT 45.9 44.0  PLT 321 289   BMET Recent Labs    03/02/20 0404 03/03/20 0601  NA 135 134*  K 4.5 4.5  CL 102 104  CO2 23 22  GLUCOSE 123* 147*  BUN 32* 34*  CREATININE 0.93 0.93  CALCIUM 9.2 8.6*   PT/INR No results for input(s): LABPROT, INR in the last 72 hours. CMP     Component Value Date/Time   NA 134 (L) 03/03/2020 0601   K 4.5 03/03/2020 0601   CL 104 03/03/2020 0601   CO2 22 03/03/2020 0601   GLUCOSE 147 (H) 03/03/2020 0601   BUN 34 (H) 03/03/2020 0601   CREATININE 0.93 03/03/2020 0601   CALCIUM 8.6 (L) 03/03/2020 0601   PROT 5.6 (L) 03/03/2020 0601   ALBUMIN 2.7 (L) 03/03/2020 0601   AST 18 03/03/2020 0601   ALT 31 03/03/2020 0601   ALKPHOS 88 03/03/2020 0601   BILITOT 0.7 03/03/2020 0601   GFRNONAA >60 03/03/2020 0601    GFRAA >60 03/03/2020 0601   Lipase     Component Value Date/Time   LIPASE 26 02/18/2020 1408       Studies/Results: No results found.  Anti-infectives: Anti-infectives (From admission, onward)   Start     Dose/Rate Route Frequency Ordered Stop   02/19/20 2300  piperacillin-tazobactam (ZOSYN) IVPB 3.375 g  Status:  Discontinued     3.375 g 12.5 mL/hr over 240 Minutes Intravenous Every 8 hours 02/18/20 2242 02/18/20 2243   02/18/20 2300  piperacillin-tazobactam (ZOSYN) IVPB 3.375 g     3.375 g 12.5 mL/hr over 240 Minutes Intravenous Every 8 hours 02/18/20 2243 03/03/20 0252   02/18/20 1615  cefTRIAXone (ROCEPHIN) 2 g in sodium chloride 0.9 % 100 mL IVPB     2 g 200 mL/hr over 30 Minutes Intravenous  Once 02/18/20 1606 02/18/20 1825   02/18/20 1615  metroNIDAZOLE (FLAGYL) IVPB 500 mg     500 mg 100 mL/hr over 60 Minutes Intravenous  Once 02/18/20 1606 02/18/20 1817       Assessment/Plan Hypertension COPD/Hx tobacco use x33 years BPH Chronic back pain Hx retinal detachment Moderate malnutrition  Acute perforated appendicitis with purulent peritonitis Laparoscopic partialcecetomy with appendectomy, 19 French Blake drain placement 02/18/2020 Dr. Beverely Pace #14 Post opIleus/fluid collections - CT 2/25- IRfelt these were not amenable to drainage at that time - CT 3/1 - some improvement in RLQ  mesenteric and paracolic gutter collections, sbo with transition at RLQ mesentery - WBC15.7 yesterday, afebrile, drain output serous - abx stopped, repeat cbc tomorrow  - continue TPN- prealbumin 28.4 (3/1) - JP removed 3/3 - removed NGT and will continue CLD today - possibly advance to FLD tomorrow if doing well, will wean TPN to 1/2 when patient advances to FLD  FEN:TPN, CLD ID: Rocephin/Flagyl preop 2/18; Zosyn 2/18>3/4 [id number] Follow-up: Dr. Nadeen Landau   Plan:Continue TPN and mobilization. CLD.  LOS: 14 days    Brigid Re ,  [facility] 03/03/2020, 8:10 AM Please see Amion for pager number during day hours 7:00am-4:30pm

## 2020-03-04 LAB — BASIC METABOLIC PANEL
Anion gap: 9 (ref 5–15)
BUN: 31 mg/dL — ABNORMAL HIGH (ref 8–23)
CO2: 23 mmol/L (ref 22–32)
Calcium: 8.6 mg/dL — ABNORMAL LOW (ref 8.9–10.3)
Chloride: 104 mmol/L (ref 98–111)
Creatinine, Ser: 0.77 mg/dL (ref 0.61–1.24)
GFR calc Af Amer: >60 mL/min (ref >60)
GFR calc non Af Amer: >60 mL/min (ref >60)
Glucose, Bld: 166 mg/dL — ABNORMAL HIGH (ref 70–99)
Potassium: 4.2 mmol/L (ref 3.5–5.1)
Sodium: 136 mmol/L (ref 135–145)

## 2020-03-04 LAB — GLUCOSE, CAPILLARY
Glucose-Capillary: 104 mg/dL — ABNORMAL HIGH (ref 70–99)
Glucose-Capillary: 128 mg/dL — ABNORMAL HIGH (ref 70–99)
Glucose-Capillary: 108 mg/dL — ABNORMAL HIGH (ref 70–99)

## 2020-03-04 LAB — CBC
HCT: 39.5 % (ref 39.0–52.0)
Hemoglobin: 12.9 g/dL — ABNORMAL LOW (ref 13.0–17.0)
MCH: 31.9 pg (ref 26.0–34.0)
MCHC: 32.7 g/dL (ref 30.0–36.0)
MCV: 97.5 fL (ref 80.0–100.0)
Platelets: 208 10*3/uL (ref 150–400)
RBC: 4.05 MIL/uL — ABNORMAL LOW (ref 4.22–5.81)
RDW: 13 % (ref 11.5–15.5)
WBC: 9.2 10*3/uL (ref 4.0–10.5)
nRBC: 0 % (ref 0.0–0.2)

## 2020-03-04 MED ORDER — ENSURE ENLIVE PO LIQD: 237.0000 mL | Freq: Three times a day (TID) | ORAL | Status: DC

## 2020-03-04 MED ORDER — SODIUM CHLORIDE 0.9 % IV SOLN: 250.0000 mL | INTRAVENOUS | Status: DC | PRN

## 2020-03-04 MED ORDER — METHOCARBAMOL 500 MG PO TABS: 500.0000 mg | ORAL_TABLET | Freq: Three times a day (TID) | ORAL | Status: DC | PRN

## 2020-03-04 MED ORDER — SODIUM CHLORIDE 0.9% FLUSH: 3.0000 mL | INTRAVENOUS | Status: DC | PRN

## 2020-03-04 MED ORDER — ACETAMINOPHEN 325 MG PO TABS: 650.0000 mg | ORAL_TABLET | Freq: Four times a day (QID) | ORAL | Status: DC | PRN

## 2020-03-04 MED ORDER — ADULT MULTIVITAMIN W/MINERALS CH
1.0000 | ORAL_TABLET | Freq: Every day | ORAL | Status: DC
  Administered 2020-03-05: 1 via ORAL
  Filled 2020-03-04: qty 1

## 2020-03-04 MED ORDER — SODIUM CHLORIDE 0.9% FLUSH
3.0000 mL | Freq: Two times a day (BID) | INTRAVENOUS | Status: DC
  Administered 2020-03-04 (×2): 3 mL via INTRAVENOUS

## 2020-03-04 NOTE — Progress Notes (Signed)
Nutrition Follow-up  DOCUMENTATION CODES:   Obesity unspecified  INTERVENTION:   -MVI with minerals daily -D/c Boost Breeze po TID, each supplement provides 250 kcal and 9 grams of protein -Ensure Enlive po TID, each supplement provides 350 kcal and 20 grams of protein (this lactose-free, so compatible with pt's lactose intolerance) -D/c TPN per pharmacy  NUTRITION DIAGNOSIS:   Increased nutrient needs related to post-op healing as evidenced by estimated needs.  Ongoing  GOAL:   Patient will meet greater than or equal to 90% of their needs  Progressing   MONITOR:   PO intake, Supplement acceptance, Diet advancement, Labs, Weight trends, Skin, I & O's  REASON FOR ASSESSMENT:   Consult New TPN/TNA  ASSESSMENT:   Mr. Deoliveira is a very pleasant 62yoM with hx of HTN, HLD, glaucoma, kidney stones, "mild copd" whom presented to ED today with 1d hx of lower quadrant abdominal pain that has steadily progressed. Now localized in RLQ. Denies ever having had this kind of pain before. Pain does not radiate. Nothing makes it better/worse. Associated with chills. Denies n/v. Denies blood in stool. Denies weight loss.  2/18- s/pPROCEDURE:Laparoscopic partial cecectomy (with appendectomy) 2/20- NGT placed 2/25- PICC placed, TPN initiated, CT scan revealedrevealed fluid collections on rt pelvis andright peritoneum/paracolic(small and not approachable per IR) 3/1- CT of abdomen and pelvis reveals improving RLQ abscess but with adjacent SBO secondary to early adhesive diease 3/3- advanced to clear liquid diet  3/4- NGT removed  3/5- TPN d/c, advanced to soft diet  Reviewed I/O's: +3.3L x 24 hours and -538 ml since 02/19/20  Per general surgery note, TPN d/c today. He has been advanced to a soft diet. Noted meal completion variable; PO: 0-75%. Pt consuming Boost Breeze supplements.   Medications reviewed and include colace and miralax.   Labs reviewed: CBGS: 104-157  Diet Order:    Diet Order            DIET SOFT Room service appropriate? Yes; Fluid consistency: Thin  Diet effective now              EDUCATION NEEDS:   Education needs have been addressed  Skin:  Skin Assessment: Skin Integrity Issues: Skin Integrity Issues:: Incisions Incisions: closed abdomen  Last BM:  03/02/20  Height:   Ht Readings from Last 1 Encounters:  02/18/20 5\' 10"  (1.778 m)    Weight:   Wt Readings from Last 1 Encounters:  02/18/20 106.6 kg    Ideal Body Weight:  75.5 kg  BMI:  Body mass index is 33.72 kg/m.  Estimated Nutritional Needs:   Kcal:  [id number]  Protein:  125-150 grams  Fluid:  > 2.3 L    Loistine Chance, RD, LDN, [place] Registered Dietitian II Certified Diabetes Care and Education Specialist Please refer to [facility] for RD and/or RD on-call/weekend/after hours pager

## 2020-03-04 NOTE — Discharge Instructions (Signed)
Ileus  Ileus is a condition that happens when the intestines, which are also called bowels, stop working correctly. The intestines are hollow organs that digest food after the food leaves the stomach. These organs are long, muscular tubes that connect the stomach to the rectum. When ileus occurs, the muscular contractions that cause food to move through the intestines do not happen as they normally would. If the intestines stop working, food cannot pass through to get digested. This condition is a serious problem that usually requires hospitalization. It can cause symptoms such as nausea, abdominal pain, and bloating. Ileus can last from a few hours to a few days. What are the causes? This condition may be caused by:  Surgery on the abdomen.  An infection or inflammation in the abdomen. This includes inflammation of the lining of the abdomen (peritonitis).  Infection or inflammation in other parts of the body, such as pneumonia or pancreatitis.  Passage of gallstones or kidney stones.  Damage to the nerves or blood vessels that go to the intestines.  A collection of blood within the abdominal cavity.  Imbalance in the salts in the blood (electrolytes).  Injury to the brain or spinal cord.  Medicines. Many medicines, including strong pain medicines, can cause ileus or make it worse. If the intestines stop working because of a blockage, that is a different condition that is called a bowel obstruction. What are the signs or symptoms? Symptoms of this condition include:  Bloating of the abdomen.  Pain or discomfort in the abdomen.  Poor appetite.  Nausea and vomiting.  Lack of normal bowel sounds, such as "growling" in the stomach. How is this diagnosed? This condition may be diagnosed with:  A physical exam and medical history.  X-rays or a CT scan of the abdomen. You may also have other tests to help find the cause of the condition. How is this treated? This condition may  be treated by:  Resting the intestines until they start to work again. This is often done by: ? Stopping oral intake of food and drink. You will be given fluid through an IV to prevent dehydration. ? Placing a small tube (nasogastric tube or NG tube) that is passed through your nose and into your stomach. The tube is attached to a suction device and keeps the stomach emptied out. This allows the bowels to rest and helps to reduce nausea and vomiting.  Correcting any electrolyte imbalance by giving supplements in the IV fluid.  Stopping any medicines that might make ileus worse.  Treating any condition that may have caused ileus. Follow these instructions at home: Eating and drinking   Follow instructions from your health care provider about: ? What to eat and drink. You may be told to start eating a bland diet. Over time, you may slowly resume a more normal, healthy diet. ? How much to eat and drink. You should eat small meals often and stop eating when you feel full.  Avoid alcohol. General instructions  Take over-the-counter and prescription medicines only as told by your health care provider.  Rest as told by your health care provider.  Avoid sitting for a long time without moving. Get up to take short walks every 1-2 hours. Ask for help if you feel weak or unsteady.  Keep all follow-up visits as told by your health care provider. This is important. Contact a health care provider if:  You have nausea, vomiting, or abdominal discomfort.  You have a fever. Get help  right away if:  You have severe abdominal pain or bloating.  You cannot eat or drink without vomiting. Summary  Ileus is a condition that happens when the intestines, which are also called bowels, stop working correctly.  When ileus occurs, the muscular contractions that cause food to move through the intestines do not happen as they normally would.  Ileus can cause symptoms such as nausea, abdominal pain, and  bloating.  Treatment may involve getting IV fluids and having a nasogastric tube placed to keep your stomach emptied out until the intestines start working again. This information is not intended to replace advice given to you by your health care provider. Make sure you discuss any questions you have with your health care provider. Document Revised: 04/14/2018 Document Reviewed: 04/14/2018 Elsevier Patient Education  2020 [employer] LAPAROSCOPIC SURGERY: POST OP INSTRUCTIONS Always review your discharge instruction sheet given to you by the facility where your surgery was performed. IF YOU HAVE DISABILITY OR FAMILY LEAVE FORMS, YOU MUST BRING THEM TO THE OFFICE FOR PROCESSING.   DO NOT GIVE THEM TO YOUR DOCTOR.  PAIN CONTROL  1. First take acetaminophen (Tylenol) AND/or ibuprofen (Advil) to control your pain after surgery.  Follow directions on package.  Taking acetaminophen (Tylenol) and/or ibuprofen (Advil) regularly after surgery will help to control your pain and lower the amount of prescription pain medication you may need.  You should not take more than 3,000 mg (3 grams) of acetaminophen (Tylenol) in 24 hours.  You should not take ibuprofen (Advil), aleve, motrin, naprosyn or other NSAIDS if you have a history of stomach ulcers or chronic kidney disease.  2. A prescription for pain medication may be given to you upon discharge.  Take your pain medication as prescribed, if you still have uncontrolled pain after taking acetaminophen (Tylenol) or ibuprofen (Advil). 3. Use ice packs to help control pain. 4. If you need a refill on your pain medication, please contact your pharmacy.  They will contact our office to request authorization. Prescriptions will not be filled after 5pm or on week-ends.  HOME MEDICATIONS 5. Take your usually prescribed medications unless otherwise directed.  DIET 6. You should follow a light diet the first few days after  arrival home.  Be sure to include lots of fluids daily. Avoid fatty, fried foods.   CONSTIPATION 7. It is common to experience some constipation after surgery and if you are taking pain medication.  Increasing fluid intake and taking a stool softener (such as Colace) will usually help or prevent this problem from occurring.  A mild laxative (Milk of Magnesia or Miralax) should be taken according to package instructions if there are no bowel movements after 48 hours.  WOUND/INCISION CARE 8. Most patients will experience some swelling and bruising in the area of the incisions.  Ice packs will help.  Swelling and bruising can take several days to resolve.  9. Unless discharge instructions indicate otherwise, follow guidelines below  a. STERI-STRIPS - you may remove your outer bandages 48 hours after surgery, and you may shower at that time.  You have steri-strips (small skin tapes) in place directly over the incision.  These strips should be left on the skin for 7-10 days.   b. DERMABOND/SKIN GLUE - you may shower in 24 hours.  The glue will flake off over the next 2-3 weeks. 10. Any sutures or staples will be removed at the office during your follow-up visit.  ACTIVITIES 11. You  may resume regular (light) daily activities beginning the next day--such as daily self-care, walking, climbing stairs--gradually increasing activities as tolerated.  You may have sexual intercourse when it is comfortable.  Refrain from any heavy lifting or straining until approved by your doctor. a. You may drive when you are no longer taking prescription pain medication, you can comfortably wear a seatbelt, and you can safely maneuver your car and apply brakes.  FOLLOW-UP 12. You should see your doctor in the office for a follow-up appointment approximately 2-3 weeks after your surgery.  You should have been given your post-op/follow-up appointment when your surgery was scheduled.  If you did not receive a post-op/follow-up  appointment, make sure that you call for this appointment within a day or two after you arrive home to insure a convenient appointment time.   WHEN TO CALL YOUR DOCTOR: 1. Fever over 101.0 2. Inability to urinate 3. Continued bleeding from incision. 4. Increased pain, redness, or drainage from the incision. 5. Increasing abdominal pain  The clinic staff is available to answer your questions during regular business hours.  Please don't hesitate to call and ask to speak to one of the nurses for clinical concerns.  If you have a medical emergency, go to the nearest emergency room or call 911.  A surgeon from [facility] is always on call at the hospital. [address] [place], [place], [place]  [address] ? [address], [place], [place]   [address] [phone] ? [phone] ? FAX [phone] Web site: [email or web address]

## 2020-03-04 NOTE — Discharge Summary (Signed)
[name] Discharge Summary   Patient ID: Steven Ellison MRN: [id number] DOB/AGE: 05-24-1951 36 y.o.  Admit date: 02/18/2020 Discharge date: 03/05/20  Admitting Diagnosis: Acute appendicitis  Discharge Diagnosis Perforated acute appendicitis Ileus   Consultants Interventional radiology   Imaging: No results found.  Procedures Dr. Dema Severin (02/18/20) - Laparoscopic partial cecectomy   Hospital Course:  Patient is a 69 year old male who presented to [facility] with abdominal pain .  Workup showed acute appendicitis.  Patient was admitted and underwent procedure listed above. Tolerated procedure well and was transferred to the floor. Post-operative period was complicated by prolonged ileus. Patient was started on TPN. CT scan on 2/25 showed 2 fluid collections, IR felt these were not amenable to percutaneous drainage. Repeat CT 3/1 showed improving RLQ abscess but SBO due to possible early adhesive disease. Patient continued on TPN and NGT until bowel function started to improve. Diet was advanced as tolerated.  On 03/05/20 , the patient was voiding well, tolerating diet, ambulating well, pain well controlled, vital signs stable, incisions c/d/i and felt stable for discharge home.  Patient will follow up in our office in 2 weeks and knows to call with questions or concerns.  He will call to confirm appointment date/time.    Physical Exam: General:  Alert, NAD, pleasant, comfortable Abd:  Soft, ND, NT, incisions C/D/I  Allergies as of 03/05/2020   No Known Allergies     Medication List    STOP taking these medications   fenofibrate 160 MG tablet     TAKE these medications   acetaminophen 500 MG tablet Commonly known as: TYLENOL Take 500 mg by mouth at bedtime.   dorzolamide-timolol 22.3-6.8 MG/ML ophthalmic solution Commonly known as: COSOPT Place 1 drop into the right eye 2 (two) times daily.   HYDROcodone-acetaminophen 5-325 MG tablet Commonly known as:  NORCO/VICODIN Take 1 tablet by mouth every 6 (six) hours as needed for moderate pain.   lisinopril 20 MG tablet Commonly known as: ZESTRIL Take 20 mg by mouth daily.   PARoxetine 25 MG 24 hr tablet Commonly known as: PAXIL-CR Take 25 mg by mouth every morning.   Rocklatan 0.02-0.005 % Soln Generic drug: Netarsudil-Latanoprost Place 1 drop into the right eye daily.   SAW PALMETTO PO Take 1 tablet by mouth daily.   Travatan Z 0.004 % Soln ophthalmic solution Generic drug: Travoprost (BAK Free) Place 1 drop into the right eye every evening.   VITAMIN A PO Take 1 tablet by mouth daily.   Vitamin D3 250 MCG (10000 UT) capsule Take 10,000 Units by mouth daily.        Follow-up Information    Ileana Roup, MD. Go on 03/23/2020.   Specialty: General Surgery Why: Follow up appointment scheduled for 3:00 PM. Please arrive 30 min prior to appointment time for check in. Bring photo ID and insurance information with you.  Contact information: [place] [address] [phone]           Signed: Brigid Re, [facility] 03/05/2020, 9:16 AM Please see Amion for pager number during day hours 7:00am-4:30pm

## 2020-03-04 NOTE — Progress Notes (Signed)
Patient up to chair several times throughout the day. Eating and drinking with no nausea, pain,  or discomfort reported.

## 2020-03-04 NOTE — Progress Notes (Signed)
[facility] Progress Note  15 Days Post-Op  Subjective: Patient reports large BM this AM, watery. Tolerating CLD. Patient is lactose intolerant so FLD would not be a good option for patient.   Objective: Vital signs in last 24 hours: Temp:  [97.6 F (36.4 C)-98.3 F (36.8 C)] 97.6 F (36.4 C) (03/05 0413) Pulse Rate:  [61-68] 61 (03/05 0413) Resp:  [16-19] 19 (03/05 0413) BP: (143-160)/(77-91) 160/77 (03/05 0413) SpO2:  [98 %-100 %] 99 % (03/05 0413) Last BM Date: 03/02/20  Intake/Output from previous day: 03/04 0701 - 03/05 0700 In: 3280.1 [P.O.:1237; I.V.:1893.1; IV Piggyback:150] Out: -  Intake/Output this shift: Total I/O In: 300 [P.O.:300] Out: -   PE: General:sitting up,NAD Heart: regular, rate, and rhythm. Normal s1,s2. No obvious murmurs, gallops, or rubs noted. Palpable radial and pedal pulses bilaterally Lungs: CTAB, no wheezes, rhonchi, or rales noted. Respiratory effort nonlabored Abd: soft, NT,distended,+BS, dressing to previous drain site c/d/i   Lab Results:  Recent Labs    03/02/20 0404 03/04/20 0428  WBC 15.7* 9.2  HGB 14.5 12.9*  HCT 44.0 39.5  PLT 289 208   BMET Recent Labs    03/03/20 0601 03/04/20 0428  NA 134* 136  K 4.5 4.2  CL 104 104  CO2 22 23  GLUCOSE 147* 166*  BUN 34* 31*  CREATININE 0.93 0.77  CALCIUM 8.6* 8.6*   PT/INR No results for input(s): LABPROT, INR in the last 72 hours. CMP     Component Value Date/Time   NA 136 03/04/2020 0428   K 4.2 03/04/2020 0428   CL 104 03/04/2020 0428   CO2 23 03/04/2020 0428   GLUCOSE 166 (H) 03/04/2020 0428   BUN 31 (H) 03/04/2020 0428   CREATININE 0.77 03/04/2020 0428   CALCIUM 8.6 (L) 03/04/2020 0428   PROT 5.6 (L) 03/03/2020 0601   ALBUMIN 2.7 (L) 03/03/2020 0601   AST 18 03/03/2020 0601   ALT 31 03/03/2020 0601   ALKPHOS 88 03/03/2020 0601   BILITOT 0.7 03/03/2020 0601   GFRNONAA >60 03/04/2020 0428   GFRAA >60 03/04/2020 0428   Lipase      Component Value Date/Time   LIPASE 26 02/18/2020 1408       Studies/Results: No results found.  Anti-infectives: Anti-infectives (From admission, onward)   Start     Dose/Rate Route Frequency Ordered Stop   02/19/20 2300  piperacillin-tazobactam (ZOSYN) IVPB 3.375 g  Status:  Discontinued     3.375 g 12.5 mL/hr over 240 Minutes Intravenous Every 8 hours 02/18/20 2242 02/18/20 2243   02/18/20 2300  piperacillin-tazobactam (ZOSYN) IVPB 3.375 g     3.375 g 12.5 mL/hr over 240 Minutes Intravenous Every 8 hours 02/18/20 2243 03/03/20 0252   02/18/20 1615  cefTRIAXone (ROCEPHIN) 2 g in sodium chloride 0.9 % 100 mL IVPB     2 g 200 mL/hr over 30 Minutes Intravenous  Once 02/18/20 1606 02/18/20 1825   02/18/20 1615  metroNIDAZOLE (FLAGYL) IVPB 500 mg     500 mg 100 mL/hr over 60 Minutes Intravenous  Once 02/18/20 1606 02/18/20 1817       Assessment/Plan Hypertension COPD/Hx tobacco use x33 years BPH Chronic back pain Hx retinal detachment Moderate malnutrition  Acute perforated appendicitis with purulent peritonitis Laparoscopic partialcecetomy with appendectomy, 19 French Blake drain placement 02/18/2020 Dr. Beverely Pace #15 Post opIleus/fluid collections - CT 2/25- IRfelt these were not amenable to drainage at that time - CT 3/1 - some improvement in RLQ mesenteric and paracolic  gutter collections, sbo with transition at RLQ mesentery - WBCnormalized and patient afebrile  - continue TPN- prealbumin 28.4(3/1) - JP removed 3/3 -d/c TPN and start soft diet  [id number], SLIV ID: Rocephin/Flagyl preop 2/18; Zosyn 2/18>3/4 [id number] Follow-up: Dr. Nadeen Landau   Plan:Soft diet, home when tolerating soft diet and having bowel function.   LOS: 15 days    Brigid Re , [facility] 03/04/2020, 10:34 AM Please see Amion for pager number during day hours 7:00am-4:30pm

## 2020-03-05 MED ORDER — HYDROCODONE-ACETAMINOPHEN 5-325 MG PO TABS: 1.0000 | ORAL_TABLET | Freq: Four times a day (QID) | ORAL | 0 refills | Status: AC | PRN

## 2020-03-05 NOTE — Progress Notes (Signed)
I was asked by nursing staff to change location of patient's [facility] from [facility] to [facility].  I have sent patient's prescription to [facility].  I contacted [facility] and canceled his Norco prescription.

## 2020-03-05 NOTE — Progress Notes (Signed)
Discharged home today accompanied by son in-law. Discharged instructions,personal belongings given to patient.Advised to pick up medications called in to pharmacy of choice.

## 2020-03-05 NOTE — Progress Notes (Signed)
PICC line removed per order and line is intact. Vaseline gauze and gauze dressing applied and pressure held. Site clean, dry, and intact. Patient and RN aware that patient is on bedrest for 30 minutes. Patient aware to leave dressing on and dry for 24 hours.  

## 2020-03-18 ENCOUNTER — Encounter (HOSPITAL_COMMUNITY): Payer: Self-pay | Admitting: Surgery

## 2020-05-02 DIAGNOSIS — H401121 Primary open-angle glaucoma, left eye, mild stage: Secondary | ICD-10-CM | POA: Diagnosis not present

## 2020-05-02 DIAGNOSIS — H401113 Primary open-angle glaucoma, right eye, severe stage: Secondary | ICD-10-CM | POA: Diagnosis not present

## 2020-06-02 DIAGNOSIS — E782 Mixed hyperlipidemia: Secondary | ICD-10-CM | POA: Diagnosis not present

## 2020-06-02 DIAGNOSIS — I10 Essential (primary) hypertension: Secondary | ICD-10-CM | POA: Diagnosis not present

## 2020-06-02 DIAGNOSIS — N400 Enlarged prostate without lower urinary tract symptoms: Secondary | ICD-10-CM | POA: Diagnosis not present

## 2020-06-02 DIAGNOSIS — F330 Major depressive disorder, recurrent, mild: Secondary | ICD-10-CM | POA: Diagnosis not present

## 2020-06-02 DIAGNOSIS — E669 Obesity, unspecified: Secondary | ICD-10-CM | POA: Diagnosis not present

## 2020-07-11 DIAGNOSIS — H401121 Primary open-angle glaucoma, left eye, mild stage: Secondary | ICD-10-CM | POA: Diagnosis not present

## 2020-07-11 DIAGNOSIS — H401113 Primary open-angle glaucoma, right eye, severe stage: Secondary | ICD-10-CM | POA: Diagnosis not present

## 2020-08-25 DIAGNOSIS — H401113 Primary open-angle glaucoma, right eye, severe stage: Secondary | ICD-10-CM | POA: Diagnosis not present

## 2020-08-25 DIAGNOSIS — H401121 Primary open-angle glaucoma, left eye, mild stage: Secondary | ICD-10-CM | POA: Diagnosis not present

## 2020-10-28 DIAGNOSIS — H401121 Primary open-angle glaucoma, left eye, mild stage: Secondary | ICD-10-CM | POA: Diagnosis not present

## 2020-10-28 DIAGNOSIS — Z961 Presence of intraocular lens: Secondary | ICD-10-CM | POA: Diagnosis not present

## 2020-10-28 DIAGNOSIS — H35372 Puckering of macula, left eye: Secondary | ICD-10-CM | POA: Diagnosis not present

## 2020-10-28 DIAGNOSIS — H33023 Retinal detachment with multiple breaks, bilateral: Secondary | ICD-10-CM | POA: Diagnosis not present

## 2020-10-28 DIAGNOSIS — H401113 Primary open-angle glaucoma, right eye, severe stage: Secondary | ICD-10-CM | POA: Diagnosis not present

## 2020-12-26 DIAGNOSIS — F330 Major depressive disorder, recurrent, mild: Secondary | ICD-10-CM | POA: Diagnosis not present

## 2020-12-26 DIAGNOSIS — I10 Essential (primary) hypertension: Secondary | ICD-10-CM | POA: Diagnosis not present

## 2020-12-26 DIAGNOSIS — E782 Mixed hyperlipidemia: Secondary | ICD-10-CM | POA: Diagnosis not present

## 2020-12-26 DIAGNOSIS — I700 Atherosclerosis of aorta: Secondary | ICD-10-CM | POA: Diagnosis not present

## 2020-12-26 DIAGNOSIS — Z0000 Encounter for general adult medical examination without abnormal findings: Secondary | ICD-10-CM | POA: Diagnosis not present

## 2020-12-26 DIAGNOSIS — E669 Obesity, unspecified: Secondary | ICD-10-CM | POA: Diagnosis not present

## 2020-12-26 DIAGNOSIS — N400 Enlarged prostate without lower urinary tract symptoms: Secondary | ICD-10-CM | POA: Diagnosis not present

## 2021-01-23 DIAGNOSIS — I10 Essential (primary) hypertension: Secondary | ICD-10-CM | POA: Diagnosis not present

## 2021-02-23 DIAGNOSIS — H401121 Primary open-angle glaucoma, left eye, mild stage: Secondary | ICD-10-CM | POA: Diagnosis not present

## 2021-02-23 DIAGNOSIS — H401113 Primary open-angle glaucoma, right eye, severe stage: Secondary | ICD-10-CM | POA: Diagnosis not present

## 2021-02-24 DIAGNOSIS — I10 Essential (primary) hypertension: Secondary | ICD-10-CM | POA: Diagnosis not present

## 2021-03-20 DIAGNOSIS — H401121 Primary open-angle glaucoma, left eye, mild stage: Secondary | ICD-10-CM | POA: Diagnosis not present

## 2021-03-20 DIAGNOSIS — H401113 Primary open-angle glaucoma, right eye, severe stage: Secondary | ICD-10-CM | POA: Diagnosis not present

## 2021-05-22 DIAGNOSIS — K5790 Diverticulosis of intestine, part unspecified, without perforation or abscess without bleeding: Secondary | ICD-10-CM | POA: Diagnosis not present

## 2021-05-22 DIAGNOSIS — D126 Benign neoplasm of colon, unspecified: Secondary | ICD-10-CM | POA: Diagnosis not present

## 2021-06-08 IMAGING — DX DG CHEST 1V PORT
1 series · 1 of 1 positions shown · non-contrast
Comparison: June 15, 2015

CLINICAL DATA: Rapid heart rate

EXAM:
PORTABLE CHEST 1 VIEW

[chest ap]
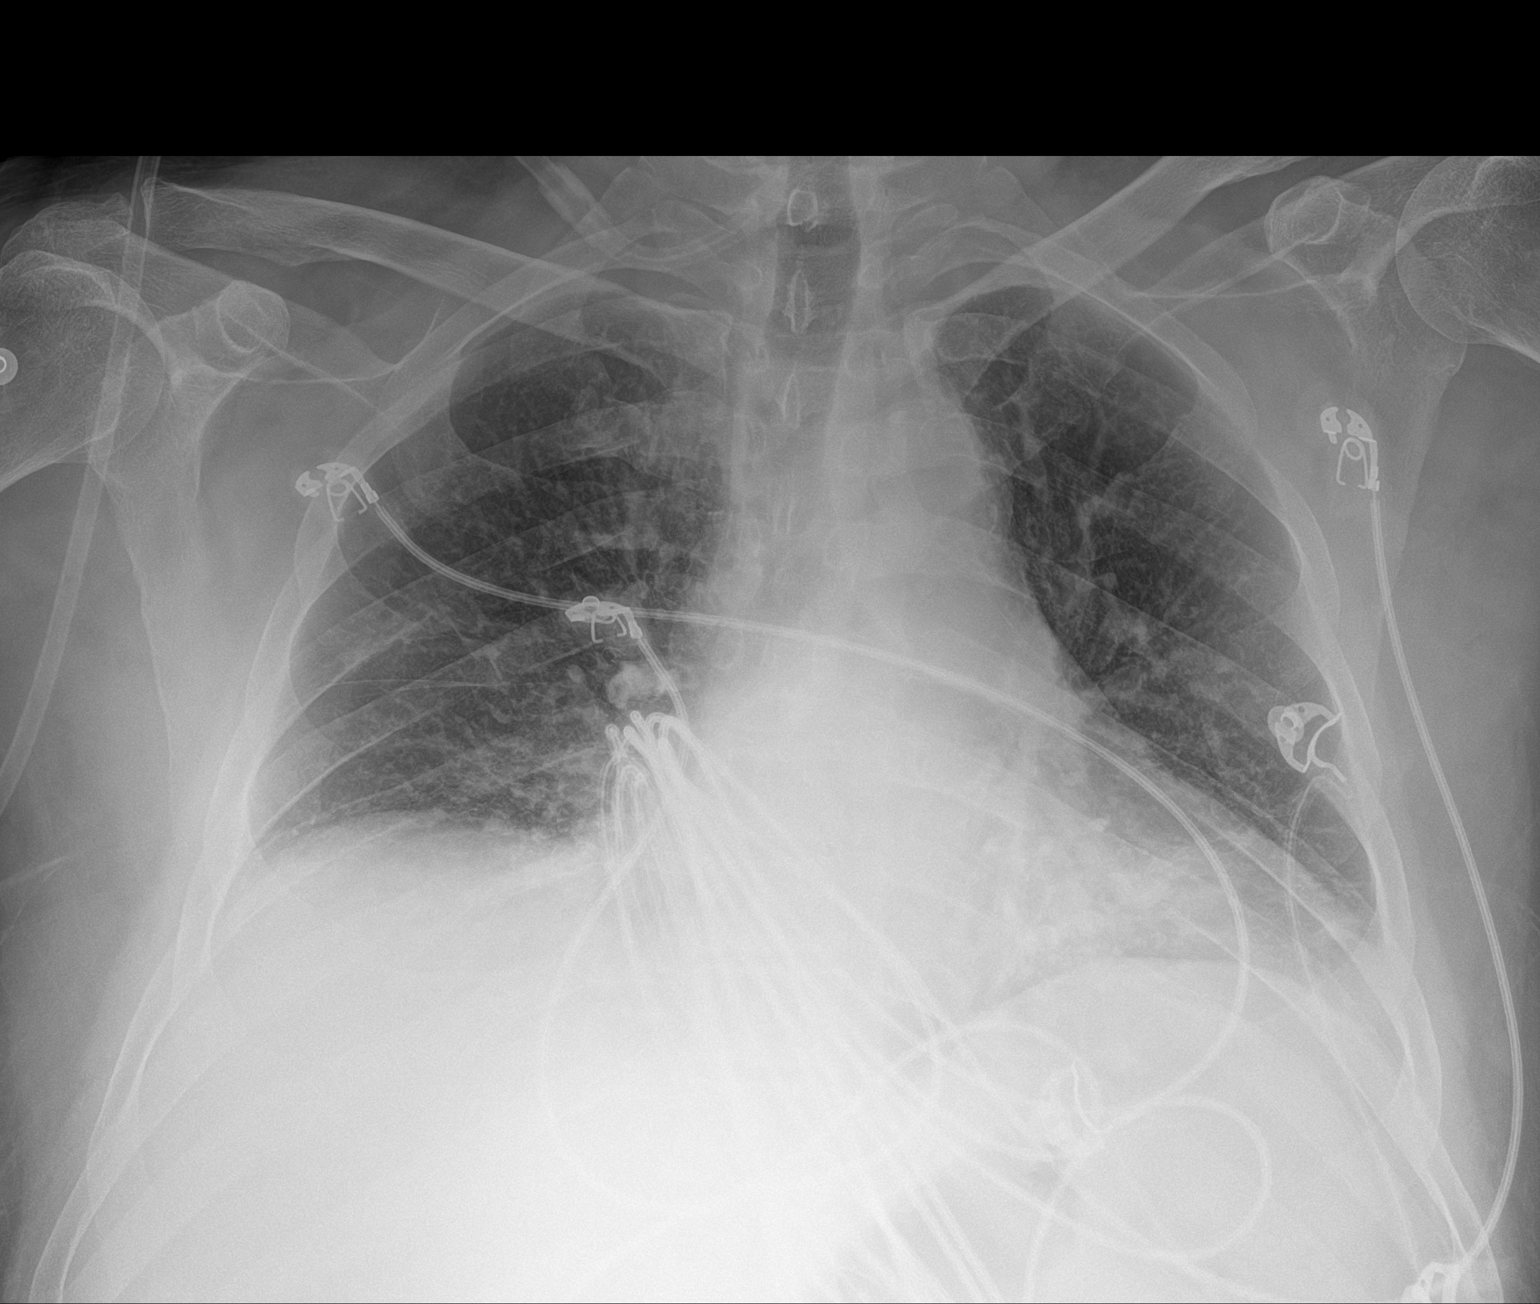

[1 of 1 positions shown; findings below may reference images not displayed]

FINDINGS: The heart, hila, and mediastinum are unremarkable. Mild interstitial
prominence. This is a low volume portable technique. No nodules or
masses. No pneumothorax. Mild atelectasis in the bases. No focal
infiltrates.
IMPRESSION: Mild interstitial prominence may represent mild pulmonary venous
congestion versus vascular crowding from low lung volumes. No
suspicious infiltrates are identified. Mild atelectasis in the
bases.

## 2021-06-08 IMAGING — CT CT ABD-PELV W/ CM
2 of 5 series · 15 of 46 positions shown, 17 images · IV contrast (Omni 300)
Comparison: None.

CLINICAL DATA: Acute nonlocalized abdominal pain. The pain radiates
into the right testicle. History of kidney stones.

EXAM:
CT ABDOMEN AND PELVIS WITH CONTRAST
TECHNIQUE: Multidetector CT imaging of the abdomen and pelvis was performed
using the standard protocol following bolus administration of
intravenous contrast.
CONTRAST:  100mL OMNIPAQUE IOHEXOL 300 MG/ML  SOLN

[Series 3: a/p w/ 5mm · axial · 0.98mm/px · z∈[+744,+1274]mm · 12 of 120 slices shown, 14 images]
[im 7/120  soft-tissue]
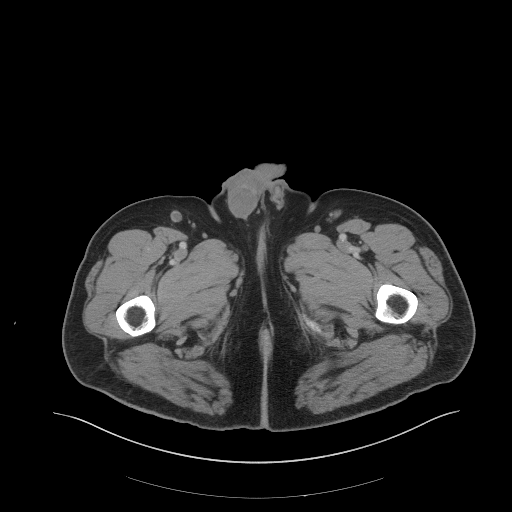
[im 7/120  bone]
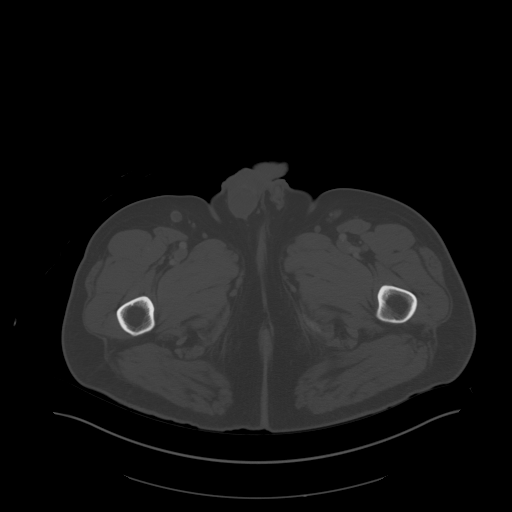
[im 19/120  soft-tissue]
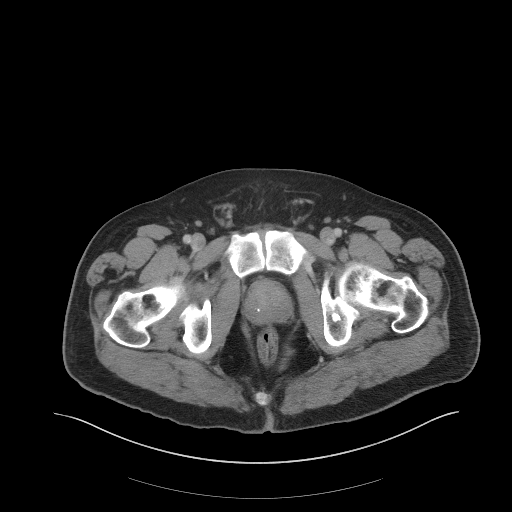
[im 26/120  soft-tissue]
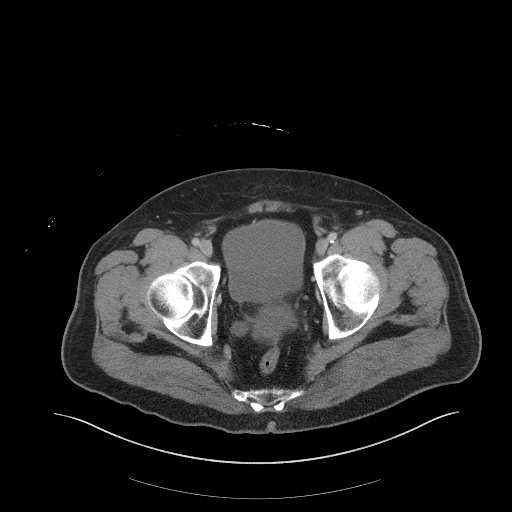
[im 38/120  soft-tissue]
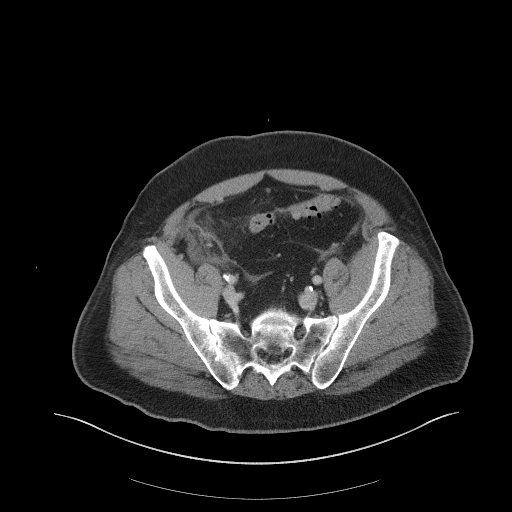
[im 44/120  soft-tissue]
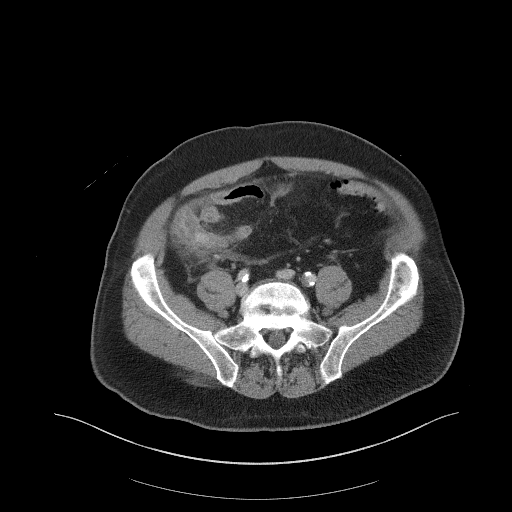
[im 57/120  soft-tissue]
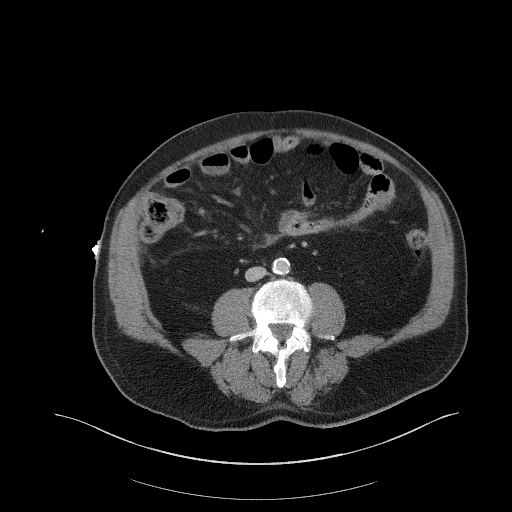
[im 63/120  soft-tissue]
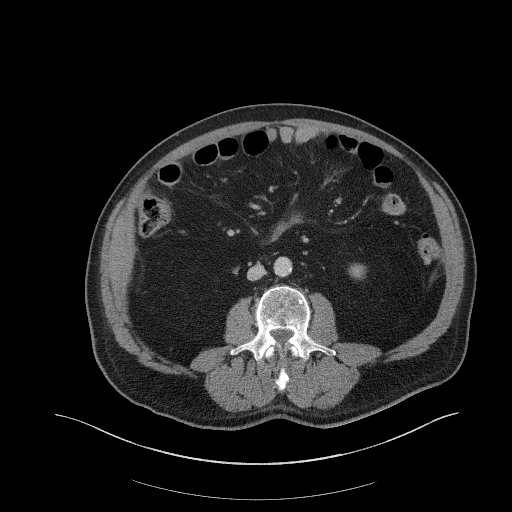
[im 76/120  soft-tissue]
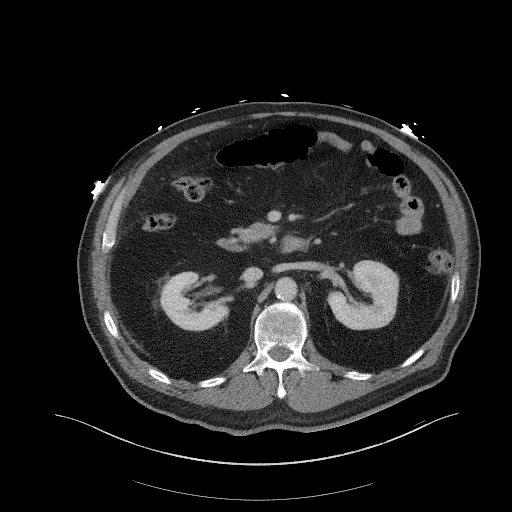
[im 82/120  soft-tissue]
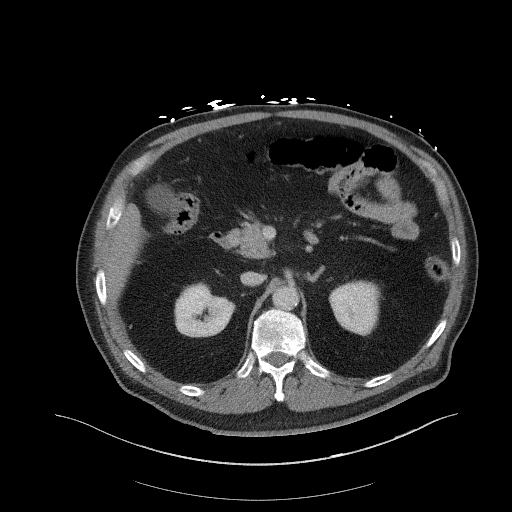
[im 82/120  bone]
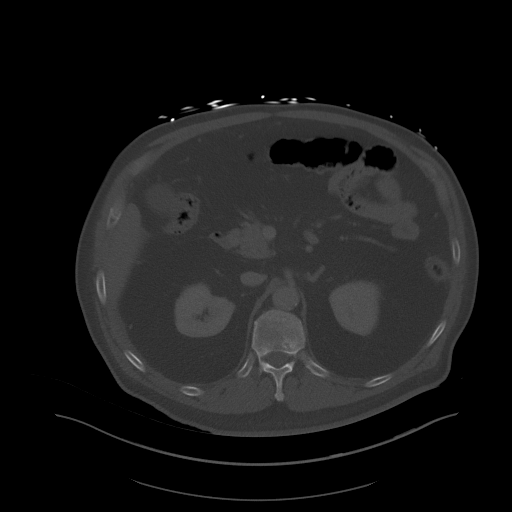
[im 94/120  soft-tissue]
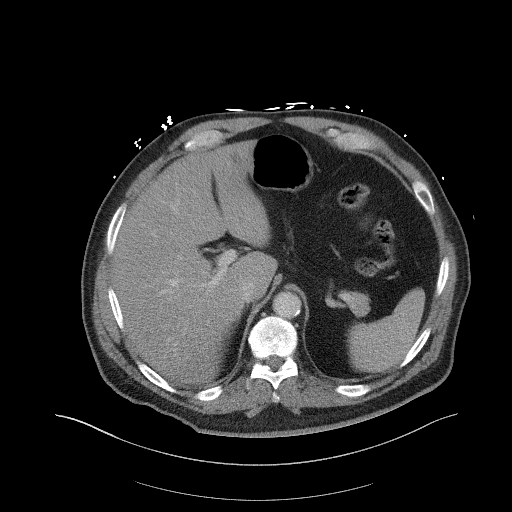
[im 101/120  soft-tissue]
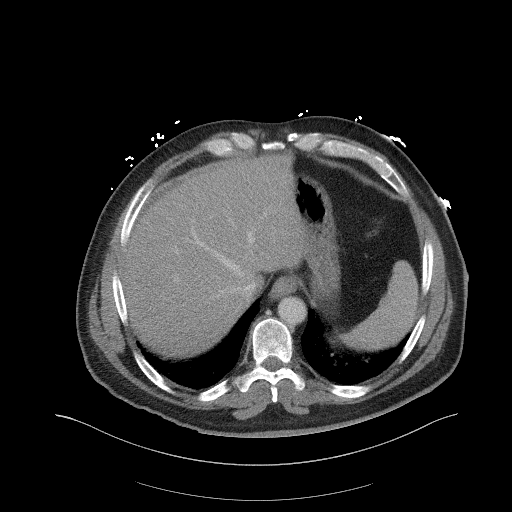
[im 113/120  soft-tissue]
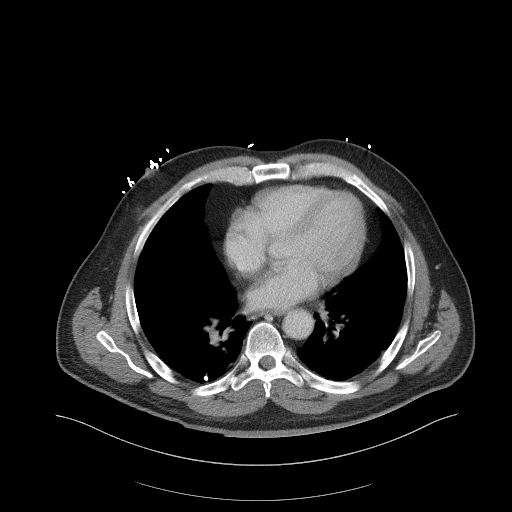

[Series 6: a/p w/ cor · coronal · 0.90mm/px · 3 of 179 slices shown]
[im 60/179  soft-tissue]
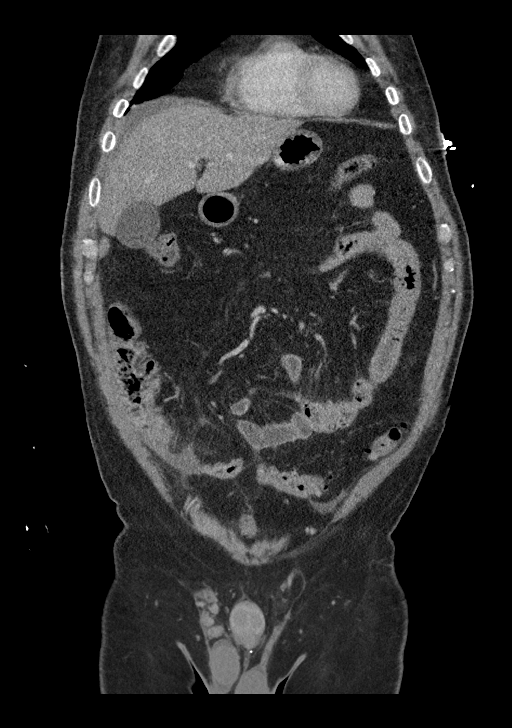
[im 80/179  soft-tissue]
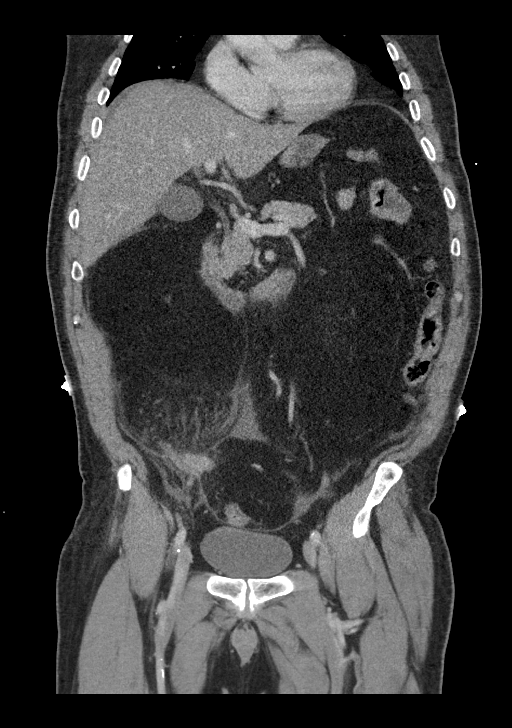
[im 99/179  soft-tissue]
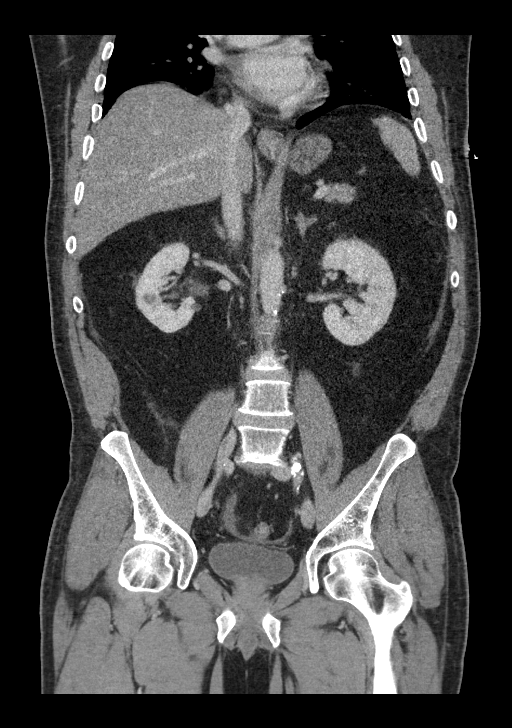

[15 of 46 positions shown; findings below may reference images not displayed]

FINDINGS: Lower chest: No acute abnormality.

Hepatobiliary: Hepatic steatosis. 14 mm cyst in the inferior aspect
of the left lobe. Biliary tree is normal.

Pancreas: Unremarkable. No pancreatic ductal dilatation or
surrounding inflammatory changes.

Spleen: Normal in size without focal abnormality.

Adrenals/Urinary Tract: Normal adrenal glands. 8 mm cyst in the
lower pole of the left kidney. Eleven and 8 and 3 mm cyst in the
lower pole of the right kidney. No hydronephrosis. Bladder is
normal.

Stomach/Bowel: Patient has acute appendicitis. The appendix is
markedly distended to a diameter of 18 mm with extensive
periappendiceal soft tissue stranding with some free fluid in the
pelvis and extending in the right pericolic gutter and across the
midline. No discrete abscess or free air. Terminal ileum appears
normal.

Stomach is normal. Multiple diverticula in the left side of the
colon.

Vascular/Lymphatic: Aortic atherosclerosis. No enlarged abdominal or
pelvic lymph nodes.

Reproductive: Prostate is unremarkable.

Other: No abdominal wall hernia.

Musculoskeletal: No acute abnormality. Bilateral pars defects at L5
with degenerative disc disease at L5-S1. Minimal spondylolisthesis
at L5-S1.
IMPRESSION: 1. Acute appendicitis with extensive periappendiceal soft tissue
stranding and free fluid in the pelvis and right pericolic gutter.
2. Hepatic steatosis.
3. Diverticulosis of the left side of the colon.
4. Aortic atherosclerosis.
5. Bilateral pars defects at L5 with degenerative disc disease at
L5-S1 and minimal spondylolisthesis at L5-S1.
6. Critical Value/emergent results were called by telephone at the
time of interpretation on 02/18/2020 at ~4: 00 pm to provider
OKEANOVA, DUYMA-HOTEL , who verbally acknowledged these results.

Aortic Atherosclerosis ([id number]-[id number].R).

## 2021-06-21 DIAGNOSIS — H401113 Primary open-angle glaucoma, right eye, severe stage: Secondary | ICD-10-CM | POA: Diagnosis not present

## 2021-06-21 DIAGNOSIS — H401121 Primary open-angle glaucoma, left eye, mild stage: Secondary | ICD-10-CM | POA: Diagnosis not present

## 2021-06-26 DIAGNOSIS — I10 Essential (primary) hypertension: Secondary | ICD-10-CM | POA: Diagnosis not present

## 2021-06-26 DIAGNOSIS — E782 Mixed hyperlipidemia: Secondary | ICD-10-CM | POA: Diagnosis not present

## 2021-06-26 DIAGNOSIS — N400 Enlarged prostate without lower urinary tract symptoms: Secondary | ICD-10-CM | POA: Diagnosis not present

## 2021-06-26 DIAGNOSIS — E669 Obesity, unspecified: Secondary | ICD-10-CM | POA: Diagnosis not present

## 2021-06-26 DIAGNOSIS — F330 Major depressive disorder, recurrent, mild: Secondary | ICD-10-CM | POA: Diagnosis not present

## 2021-09-14 DIAGNOSIS — K5730 Diverticulosis of large intestine without perforation or abscess without bleeding: Secondary | ICD-10-CM | POA: Diagnosis not present

## 2021-09-14 DIAGNOSIS — D124 Benign neoplasm of descending colon: Secondary | ICD-10-CM | POA: Diagnosis not present

## 2021-09-14 DIAGNOSIS — K648 Other hemorrhoids: Secondary | ICD-10-CM | POA: Diagnosis not present

## 2021-09-14 DIAGNOSIS — D122 Benign neoplasm of ascending colon: Secondary | ICD-10-CM | POA: Diagnosis not present

## 2021-09-14 DIAGNOSIS — Z86010 Personal history of colonic polyps: Secondary | ICD-10-CM | POA: Diagnosis not present

## 2021-09-14 DIAGNOSIS — K6389 Other specified diseases of intestine: Secondary | ICD-10-CM | POA: Diagnosis not present

## 2021-09-19 DIAGNOSIS — D122 Benign neoplasm of ascending colon: Secondary | ICD-10-CM | POA: Diagnosis not present

## 2021-09-19 DIAGNOSIS — D124 Benign neoplasm of descending colon: Secondary | ICD-10-CM | POA: Diagnosis not present

## 2021-10-25 DIAGNOSIS — H401121 Primary open-angle glaucoma, left eye, mild stage: Secondary | ICD-10-CM | POA: Diagnosis not present

## 2021-10-25 DIAGNOSIS — H401113 Primary open-angle glaucoma, right eye, severe stage: Secondary | ICD-10-CM | POA: Diagnosis not present

## 2021-12-04 DIAGNOSIS — H401121 Primary open-angle glaucoma, left eye, mild stage: Secondary | ICD-10-CM | POA: Diagnosis not present

## 2021-12-04 DIAGNOSIS — H401113 Primary open-angle glaucoma, right eye, severe stage: Secondary | ICD-10-CM | POA: Diagnosis not present

## 2022-01-09 DIAGNOSIS — F330 Major depressive disorder, recurrent, mild: Secondary | ICD-10-CM | POA: Diagnosis not present

## 2022-01-09 DIAGNOSIS — E669 Obesity, unspecified: Secondary | ICD-10-CM | POA: Diagnosis not present

## 2022-01-09 DIAGNOSIS — N400 Enlarged prostate without lower urinary tract symptoms: Secondary | ICD-10-CM | POA: Diagnosis not present

## 2022-01-09 DIAGNOSIS — I10 Essential (primary) hypertension: Secondary | ICD-10-CM | POA: Diagnosis not present

## 2022-01-09 DIAGNOSIS — Z0000 Encounter for general adult medical examination without abnormal findings: Secondary | ICD-10-CM | POA: Diagnosis not present

## 2022-01-09 DIAGNOSIS — E782 Mixed hyperlipidemia: Secondary | ICD-10-CM | POA: Diagnosis not present

## 2022-04-09 DIAGNOSIS — H401113 Primary open-angle glaucoma, right eye, severe stage: Secondary | ICD-10-CM | POA: Diagnosis not present

## 2022-04-09 DIAGNOSIS — H401121 Primary open-angle glaucoma, left eye, mild stage: Secondary | ICD-10-CM | POA: Diagnosis not present

## 2022-06-15 DIAGNOSIS — H401121 Primary open-angle glaucoma, left eye, mild stage: Secondary | ICD-10-CM | POA: Diagnosis not present

## 2022-06-15 DIAGNOSIS — Z961 Presence of intraocular lens: Secondary | ICD-10-CM | POA: Diagnosis not present

## 2022-06-15 DIAGNOSIS — H401113 Primary open-angle glaucoma, right eye, severe stage: Secondary | ICD-10-CM | POA: Diagnosis not present

## 2022-06-15 DIAGNOSIS — H33023 Retinal detachment with multiple breaks, bilateral: Secondary | ICD-10-CM | POA: Diagnosis not present

## 2022-06-15 DIAGNOSIS — H35372 Puckering of macula, left eye: Secondary | ICD-10-CM | POA: Diagnosis not present

## 2022-07-10 DIAGNOSIS — E782 Mixed hyperlipidemia: Secondary | ICD-10-CM | POA: Diagnosis not present

## 2022-07-10 DIAGNOSIS — N400 Enlarged prostate without lower urinary tract symptoms: Secondary | ICD-10-CM | POA: Diagnosis not present

## 2022-07-10 DIAGNOSIS — E669 Obesity, unspecified: Secondary | ICD-10-CM | POA: Diagnosis not present

## 2022-07-10 DIAGNOSIS — F330 Major depressive disorder, recurrent, mild: Secondary | ICD-10-CM | POA: Diagnosis not present

## 2022-07-10 DIAGNOSIS — I10 Essential (primary) hypertension: Secondary | ICD-10-CM | POA: Diagnosis not present

## 2022-08-20 DIAGNOSIS — H401113 Primary open-angle glaucoma, right eye, severe stage: Secondary | ICD-10-CM | POA: Diagnosis not present

## 2022-08-20 DIAGNOSIS — H401121 Primary open-angle glaucoma, left eye, mild stage: Secondary | ICD-10-CM | POA: Diagnosis not present

## 2022-11-21 DIAGNOSIS — H401121 Primary open-angle glaucoma, left eye, mild stage: Secondary | ICD-10-CM | POA: Diagnosis not present

## 2022-11-21 DIAGNOSIS — H401113 Primary open-angle glaucoma, right eye, severe stage: Secondary | ICD-10-CM | POA: Diagnosis not present

## 2024-08-14 ENCOUNTER — Other Ambulatory Visit: Payer: Self-pay

## 2024-08-14 ENCOUNTER — Emergency Department (HOSPITAL_COMMUNITY)

## 2024-08-14 ENCOUNTER — Encounter (HOSPITAL_COMMUNITY): Payer: Self-pay | Admitting: Emergency Medicine

## 2024-08-14 ENCOUNTER — Emergency Department (HOSPITAL_COMMUNITY)
Admission: EM | Admit: 2024-08-14 | Discharge: 2024-08-14 | Disposition: A | Discharge status: Home / Self Care | Attending: Emergency Medicine | Admitting: Emergency Medicine

## 2024-08-14 DIAGNOSIS — R509 Fever, unspecified: Secondary | ICD-10-CM | POA: Diagnosis not present

## 2024-08-14 DIAGNOSIS — I700 Atherosclerosis of aorta: Secondary | ICD-10-CM | POA: Insufficient documentation

## 2024-08-14 DIAGNOSIS — A084 Viral intestinal infection, unspecified: Secondary | ICD-10-CM | POA: Diagnosis not present

## 2024-08-14 DIAGNOSIS — I491 Atrial premature depolarization: Secondary | ICD-10-CM | POA: Diagnosis not present

## 2024-08-14 DIAGNOSIS — R197 Diarrhea, unspecified: Secondary | ICD-10-CM | POA: Diagnosis present

## 2024-08-14 DIAGNOSIS — E860 Dehydration: Secondary | ICD-10-CM | POA: Diagnosis not present

## 2024-08-14 DIAGNOSIS — I10 Essential (primary) hypertension: Secondary | ICD-10-CM | POA: Insufficient documentation

## 2024-08-14 DIAGNOSIS — K5730 Diverticulosis of large intestine without perforation or abscess without bleeding: Secondary | ICD-10-CM | POA: Diagnosis not present

## 2024-08-14 DIAGNOSIS — J449 Chronic obstructive pulmonary disease, unspecified: Secondary | ICD-10-CM | POA: Diagnosis not present

## 2024-08-14 LAB — URINALYSIS, W/ REFLEX TO CULTURE (INFECTION SUSPECTED)
Bacteria, UA: NONE SEEN
Bilirubin Urine: NEGATIVE
Glucose, UA: NEGATIVE mg/dL
Hgb urine dipstick: NEGATIVE
Ketones, ur: 5 mg/dL — AB
Leukocytes,Ua: NEGATIVE
Nitrite: NEGATIVE
Protein, ur: NEGATIVE mg/dL
Specific Gravity, Urine: >1.046 — ABNORMAL HIGH (ref 1.005–1.030)
pH: 6 (ref 5.0–8.0)

## 2024-08-14 LAB — CBC WITH DIFFERENTIAL/PLATELET
Abs Immature Granulocytes: 0 K/uL (ref 0.00–0.07)
Basophils Absolute: 0 K/uL (ref 0.0–0.1)
Basophils Relative: 1 %
Eosinophils Absolute: 0 K/uL (ref 0.0–0.5)
Eosinophils Relative: 0 %
HCT: 44.5 % (ref 39.0–52.0)
Hemoglobin: 15.1 g/dL (ref 13.0–17.0)
Immature Granulocytes: 0 %
Lymphocytes Relative: 19 %
Lymphs Abs: 1.1 K/uL (ref 0.7–4.0)
MCH: 32.5 pg (ref 26.0–34.0)
MCHC: 33.9 g/dL (ref 30.0–36.0)
MCV: 95.7 fL (ref 80.0–100.0)
Monocytes Absolute: 0.5 K/uL (ref 0.1–1.0)
Monocytes Relative: 9 %
Neutro Abs: 4.3 K/uL (ref 1.7–7.7)
Neutrophils Relative %: 71 %
Platelets: 156 K/uL (ref 150–400)
RBC: 4.65 MIL/uL (ref 4.22–5.81)
RDW: 13.9 % (ref 11.5–15.5)
WBC: 6 K/uL (ref 4.0–10.5)
nRBC: 0 % (ref 0.0–0.2)

## 2024-08-14 LAB — POC OCCULT BLOOD, ED: Fecal Occult Bld: NEGATIVE

## 2024-08-14 LAB — COMPREHENSIVE METABOLIC PANEL WITH GFR
ALT: 24 U/L (ref 0–44)
AST: 26 U/L (ref 15–41)
Albumin: 4 g/dL (ref 3.5–5.0)
Alkaline Phosphatase: 93 U/L (ref 38–126)
Anion gap: 8 (ref 5–15)
BUN: 12 mg/dL (ref 8–23)
CO2: 24 mmol/L (ref 22–32)
Calcium: 9.1 mg/dL (ref 8.9–10.3)
Chloride: 102 mmol/L (ref 98–111)
Creatinine, Ser: 1.07 mg/dL (ref 0.61–1.24)
GFR, Estimated: >60 mL/min (ref >60)
Glucose, Bld: 113 mg/dL — ABNORMAL HIGH (ref 70–99)
Potassium: 3.8 mmol/L (ref 3.5–5.1)
Sodium: 134 mmol/L — ABNORMAL LOW (ref 135–145)
Total Bilirubin: 1.3 mg/dL — ABNORMAL HIGH (ref 0.0–1.2)
Total Protein: 6.7 g/dL (ref 6.5–8.1)

## 2024-08-14 LAB — RESP PANEL BY RT-PCR (RSV, FLU A&B, COVID)  RVPGX2
Influenza A by PCR: NEGATIVE
Influenza B by PCR: NEGATIVE
Resp Syncytial Virus by PCR: NEGATIVE
SARS Coronavirus 2 by RT PCR: NEGATIVE

## 2024-08-14 LAB — PROTIME-INR
INR: 1 (ref 0.8–1.2)
Prothrombin Time: 13.6 s (ref 11.4–15.2)

## 2024-08-14 LAB — LIPASE, BLOOD: Lipase: 41 U/L (ref 11–51)

## 2024-08-14 LAB — I-STAT CG4 LACTIC ACID, ED: Lactic Acid, Venous: 1 mmol/L (ref 0.5–1.9)

## 2024-08-14 MED ORDER — IOHEXOL 350 MG/ML SOLN
75.0000 mL | Freq: Once | INTRAVENOUS | Status: AC | PRN
  Administered 2024-08-14: 75 mL via INTRAVENOUS

## 2024-08-14 NOTE — ED Notes (Signed)
 Extra Pink top drawn for blood bank in lab

## 2024-08-14 NOTE — ED Provider Triage Note (Signed)
 Emergency Medicine Provider Triage Evaluation Note  Steven Ellison , a 73 y.o. male  was evaluated in triage.  Pt complains of diarrhea and fever.  Patient reports that he has had ongoing diarrhea for the last 2 weeks and developed fevers in the last week accompanied with diarrhea.  He reports his diarrhea at times is more brownish and is even black in appearance.  Denies any blood thinner use.  No prior history of GI bleed.  States that the fever and chills as an ongoing for the last several days without significant abatement with medication.  No sick contacts.  Review of Systems  Positive: As above Negative: As above  Physical Exam  BP (!) 176/157 (BP Location: Right Arm)   Pulse (!) 44   Temp (!) 102.1 F (38.9 C) (Oral)   Resp (!) 23   SpO2 100%  Gen:   Awake, no distress, febrile and tachypneic Resp:  Normal effort  MSK:   Moves extremities without difficulty  Other:  No focal abdominal tenderness  Medical Decision Making  Medically screening exam initiated at 1:16 PM.  Appropriate orders placed.  Steven Ellison was informed that the remainder of the evaluation will be completed by another provider, this initial triage assessment does not replace that evaluation, and the importance of remaining in the ED until their evaluation is complete.     Bernadett Milian A, PA-C 08/14/24 1317

## 2024-08-14 NOTE — ED Provider Notes (Signed)
 [place] Provider Note   CSN: [id number] Arrival date & time: 08/14/24  1253     History  Chief Complaint  Patient presents with   Suspected GI Bleed    Steven Ellison is a 73 y.o. male with PMH as listed below who presents with constipation/diarrhea and subjective fevers at ome with concern for black stool. Had appt this AM w/ Dr. Roanne who sent him to ED.  he has had ongoing diarrhea for the last 1 week and developed fevers in the last week accompanied with diarrhea. He has been intermittently constipated and managing with V8 juice and prune juice at home. He reports his diarrhea at times is more brownish and is even black in appearance.  Denies any blood thinner use.  No prior history of GI bleed. Last colonoscopy was 3 years go with no abnormal findings that patient is aware of. States that the fever and chills as an ongoing for the last several days without significant abatement with medication.  No sick contacts. Denies any abdominal pain but does endorse body aches. Denies other flu-like sxs including rhinorrhea/cough/congestion/sore throat. States has h/o perforated appendicitis but didn't have any abdominal pain when that happened.  No recent travel, antibiotics, hospitalizations.  Lives at home.  Past Medical History:  Diagnosis Date   Back pain    spurs   COPD (chronic obstructive pulmonary disease) (HCC)    mild per pt   Depression    takes Paxil  daily   Enlarged prostate    Glaucoma    uses eye drops daily   History of blood transfusion    as a child   Hyperlipidemia    takes Fenofibrate  daily   Hypertension    takes Lisinopril  daily   Kidney stone    Nocturia    takes Saw Palmetto daily   Tuberculosis    as a child   Weakness    numbness and tingling in left leg       Home Medications Prior to Admission medications   Medication Sig Start Date End Date Taking? Authorizing Provider  acetaminophen  (TYLENOL ) 500  MG tablet Take 500 mg by mouth at bedtime.     [provider]  Cholecalciferol  (VITAMIN D3) 250 MCG (10000 UT) capsule Take 10,000 Units by mouth daily.    [provider]  dorzolamide -timolol  (COSOPT ) 22.3-6.8 MG/ML ophthalmic solution Place 1 drop into the right eye 2 (two) times daily.  05/06/15   [provider]  HYDROcodone -acetaminophen  (NORCO) 5-325 MG tablet Take 1 tablet by mouth every 6 (six) hours as needed. 03/05/20   Maczis, Michael M, PA-C  HYDROcodone -acetaminophen  (NORCO/VICODIN) 5-325 MG tablet Take 1 tablet by mouth every 6 (six) hours as needed for moderate pain. 03/05/20   Belinda Cough, MD  lisinopril  (PRINIVIL ,ZESTRIL ) 20 MG tablet Take 20 mg by mouth daily. 03/13/15   [provider]  Netarsudil -Latanoprost  (ROCKLATAN ) 0.02-0.005 % SOLN Place 1 drop into the right eye daily.    [provider]  PARoxetine  (PAXIL -CR) 25 MG 24 hr tablet Take 25 mg by mouth every morning. 04/07/15   [provider]  Saw Palmetto, Serenoa repens, (SAW PALMETTO PO) Take 1 tablet by mouth daily.    [provider]  TRAVATAN Z 0.004 % SOLN ophthalmic solution Place 1 drop into the right eye every evening. 05/06/15   [provider]  VITAMIN A PO Take 1 tablet by mouth daily.    [provider]  Allergies    Patient has no known allergies.    Review of Systems   Review of Systems A 10 point review of systems was performed and is negative unless otherwise reported in HPI.  Physical Exam Updated Vital Signs BP (!) 153/64   Pulse (!) 38   Temp 99.4 F (37.4 C) (Oral)   Resp (!) 23   SpO2 100%  Physical Exam General: Normal appearing male, lying in bed.  HEENT: PERRLA, Sclera anicteric, MMM, trachea midline.  Cardiology: RRR, no murmurs/rubs/gallops.  Resp: Normal respiratory rate and effort. CTAB, no wheezes, rhonchi, crackles.  Abd: Soft, non-tender, non-distended. No rebound tenderness or guarding.  DRE:  Performed with nurse chaperone.  Unremarkable appearing external anal sphincter with no external hemorrhoids or blood noted.  No significant tenderness palpation, mass, or foreign object palpated in the rectum.  Brown stool on glove. MSK: No peripheral edema or signs of trauma. Extremities without deformity or TTP. No cyanosis or clubbing. Skin: warm, dry. No rashes or lesions. Back: No CVA tenderness Neuro: A&Ox4, CNs II-XII grossly intact. MAEs. Sensation grossly intact.  Psych: Normal mood and affect.   ED Results / Procedures / Treatments   Labs (all labs ordered are listed, but only abnormal results are displayed) Labs Reviewed  COMPREHENSIVE METABOLIC PANEL WITH GFR - Abnormal; Notable for the following components:      Result Value   Sodium 134 (*)    Glucose, Bld 113 (*)    Total Bilirubin 1.3 (*)    All other components within normal limits  URINALYSIS, W/ REFLEX TO CULTURE (INFECTION SUSPECTED) - Abnormal; Notable for the following components:   Specific Gravity, Urine >1.046 (*)    Ketones, ur 5 (*)    All other components within normal limits  CULTURE, BLOOD (ROUTINE X 2)  CULTURE, BLOOD (ROUTINE X 2)  RESP PANEL BY RT-PCR (RSV, FLU A&B, COVID)  RVPGX2  CBC WITH DIFFERENTIAL/PLATELET  PROTIME-INR  LIPASE, BLOOD  I-STAT CG4 LACTIC ACID, ED  POC OCCULT BLOOD, ED    EKG EKG Interpretation Date/Time:  Friday August 14 2024 17:59:26 EDT Ventricular Rate:  69 PR Interval:  126 QRS Duration:  109 QT Interval:  387 QTC Calculation: 415 R Axis:   54  Text Interpretation: Sinus rhythm Atrial premature complex Confirmed by Franklyn Gills [id number]) on 08/14/2024 6:43:29 PM  Radiology CT abd pelvis w contrast: 1. Multiple fluid-filled loops of small bowel with fluid in the colon as well, findings could be secondary to enteritis or ileus. 2. Diverticular disease of the colon without acute inflammatory process. 3. Aortic atherosclerosis.  CXR: No active cardiopulmonary  disease.   Procedures Procedures    Medications Ordered in ED Medications  iohexol  (OMNIPAQUE ) 350 MG/ML injection 75 mL (75 mLs Intravenous Contrast Given 08/14/24 1923)    ED Course/ Medical Decision Making/ A&P                          Medical Decision Making Amount and/or Complexity of Data Reviewed Labs:  Decision-making details documented in ED Course. Radiology: ordered. Decision-making details documented in ED Course.  Risk Prescription drug management.    This patient presents to the ED for concern of alternating diarrhea and constipation as well as fever, this involves an extensive number of treatment options, and is a complaint that carries with it a high risk of complications and morbidity.  I considered the following differential and admission for this acute, potentially life threatening condition.  MDM:    Patient with fevers chills at home as well as intermittent diarrhea and constipation.  He reports no abdominal pain.  Patient reports that he had a history of a perforated appendicitis in the past that did not present with any abdominal pain either and he was concerned about another possible intra-abdominal surgical emergency.  Outpatient provider today heard mention of black or tarry stool and was concerned about a possible GI bleed.  On presentation patient is febrile to 102.57F which decreases with Tylenol .  Reassuringly his DRE and physical exam do not demonstrate any abdominal tenderness palpation or signs of peritonitis, no gross rectal bleeding appreciated and fecal occult test is negative for blood.  His hemoglobin is within normal limits, lactate within normal limits as well.  No leukocytosis Or tachycardia to indicate sepsis and patient is very well-appearing and nontoxic.  I have great suspicion for viral gastroenteritis for this patient given his fever and GI symptoms.  He has had only a few episodes of diarrhea in the last several days and without any risk  factors for C. difficile, very low concern for C. difficile.  Lipase is negative for pancreatitis and no epigastric tenderness palpation either.  CT mentions possible ileus however with his fever and intermittent diarrhea I believe his symptoms are more consistent with enteritis.  With fever and bodyaches and concern for acute viral process I did also perform a chest x-ray which is negative for any pneumonia.  Patient is tolerating p.o. well, and is overall nontoxic-appearing.  I believe that he stable for outpatient follow-up.  I advised staying well-hydrated at home and if he is having diarrhea he can decrease his V8 and prune juice intake.  I advise following up with his PCP within 1 to 2 weeks if his symptoms do not improve or worsen.  Given specific discharge instructions and return precautions.   Clinical Course as of 08/16/24 1443  Fri Aug 14, 2024  1842 Temp(!): 102.1 F (38.9 C) [HN]  1842 Temp: 99.4 F (37.4 C) [HN]  1842 Fecal Occult Blood, POC: NEGATIVE [HN]  1843 Hemoglobin: 15.1 Stable hgb [HN]  1843 Lactic Acid, Venous: 1.0 neg [HN]  1843 DG Chest 2 View No active cardiopulmonary disease. [HN]  2019 WBC: 6.0 No leukocytosis  [HN]  2056 CT ABDOMEN PELVIS W CONTRAST 1. Multiple fluid-filled loops of small bowel with fluid in the colon as well, findings could be secondary to enteritis or ileus. 2. Diverticular disease of the colon without acute inflammatory process. 3. Aortic atherosclerosis.   [HN]  2056 Likely w/ enteritis [HN]  2229 Lipase: 41 neg [HN]    Clinical Course User Index [HN] Franklyn Sid SAILOR, MD    Labs: I Ordered, and personally interpreted labs.  The pertinent results include:  those listed above  Imaging Studies ordered: I ordered imaging studies including CT abd pelvis I independently visualized and interpreted imaging. I agree with the radiologist interpretation  Additional history obtained from chart review.    Cardiac Monitoring: The  patient was maintained on a cardiac monitor.  I personally viewed and interpreted the cardiac monitored which showed an underlying rhythm of: Normal sinus rhythm  Reevaluation: After the interventions noted above, I reevaluated the patient and found that they have :improved  Social Determinants of Health:  lives independently  Disposition:  DC w/ discharge instructions/return precautions. All questions answered to patient's satisfaction.    Co morbidities that complicate the patient evaluation  Past Medical History:  Diagnosis Date  Back pain    spurs   COPD (chronic obstructive pulmonary disease) (HCC)    mild per pt   Depression    takes Paxil  daily   Enlarged prostate    Glaucoma    uses eye drops daily   History of blood transfusion    as a child   Hyperlipidemia    takes Fenofibrate  daily   Hypertension    takes Lisinopril  daily   Kidney stone    Nocturia    takes Saw Palmetto daily   Tuberculosis    as a child   Weakness    numbness and tingling in left leg     Medicines Meds ordered this encounter  Medications   iohexol  (OMNIPAQUE ) 350 MG/ML injection 75 mL    I have reviewed the patients home medicines and have made adjustments as needed  Problem List / ED Course: Problem List Items Addressed This Visit   None Visit Diagnoses       Viral enteritis    -  Primary     Dehydration         Diarrhea of presumed infectious origin                       This note was created using dictation software, which may contain spelling or grammatical errors.    Franklyn Sid SAILOR, MD 08/16/24 [phone]

## 2024-08-14 NOTE — ED Triage Notes (Addendum)
 Pt arrived via POV reporting that his doctor sent him here today to get worked up. Pt is having a 1 week hx of fevers and diarrhea that is sometimes brown and sometimes black. Pt reports fever and chills along with generalized body aches and pains.

## 2024-08-14 NOTE — Discharge Instructions (Addendum)
 Thank you for coming to [facility] Emergency Department. You were seen for intermittent constipation/diarrhea and body aches with fever. We did an exam, labs, and imaging, and these showed likely viral gastroenteritis. With your fever and body aches, this is likely due to a viral syndrome. Please take tylenol  1,000 mg every 8 hours for fever and body aches. Please stay well hydrated at home.  Please follow up with your primary care provider within 1 week.   Do not hesitate to return to the ED or call 911 if you experience: -Worsening symptoms -Nausea/vomiting so severe you cannot eat/drink anything -Lightheadedness, passing out -Fevers/chills -Anything else that concerns you

## 2024-08-19 LAB — CULTURE, BLOOD (ROUTINE X 2)
Culture: NO GROWTH
Culture: NO GROWTH
Special Requests: ADEQUATE
